# Patient Record
Sex: Male | Born: 1940 | Race: White | Hispanic: No | Marital: Married | Smoking: Former smoker
Health system: Southern US, Community
[De-identification: ages and names within clinical notes are randomized; demographics above are authoritative.]

## PROBLEM LIST (undated history)

## (undated) DIAGNOSIS — E039 Hypothyroidism, unspecified: Secondary | ICD-10-CM

## (undated) DIAGNOSIS — M545 Low back pain, unspecified: Secondary | ICD-10-CM

## (undated) DIAGNOSIS — Z9889 Other specified postprocedural states: Secondary | ICD-10-CM

## (undated) DIAGNOSIS — G4733 Obstructive sleep apnea (adult) (pediatric): Secondary | ICD-10-CM

## (undated) DIAGNOSIS — M1990 Unspecified osteoarthritis, unspecified site: Secondary | ICD-10-CM

## (undated) DIAGNOSIS — M439 Deforming dorsopathy, unspecified: Secondary | ICD-10-CM

## (undated) DIAGNOSIS — G8929 Other chronic pain: Secondary | ICD-10-CM

## (undated) DIAGNOSIS — E7800 Pure hypercholesterolemia, unspecified: Secondary | ICD-10-CM

## (undated) DIAGNOSIS — N281 Cyst of kidney, acquired: Secondary | ICD-10-CM

## (undated) DIAGNOSIS — R112 Nausea with vomiting, unspecified: Secondary | ICD-10-CM

## (undated) DIAGNOSIS — C73 Malignant neoplasm of thyroid gland: Secondary | ICD-10-CM

## (undated) DIAGNOSIS — C61 Malignant neoplasm of prostate: Secondary | ICD-10-CM

## (undated) DIAGNOSIS — Z9989 Dependence on other enabling machines and devices: Secondary | ICD-10-CM

## (undated) DIAGNOSIS — I10 Essential (primary) hypertension: Secondary | ICD-10-CM

## (undated) HISTORY — PX: JOINT REPLACEMENT: SHX530

## (undated) HISTORY — PX: UMBILICAL HERNIA REPAIR: SHX196

## (undated) HISTORY — PX: BACK SURGERY: SHX140

## (undated) HISTORY — PX: TONSILLECTOMY: SUR1361

## (undated) HISTORY — PX: HERNIA REPAIR: SHX51

---

## 1991-08-25 HISTORY — PX: LUMBAR DISC SURGERY: SHX700

## 1999-08-25 DIAGNOSIS — C61 Malignant neoplasm of prostate: Secondary | ICD-10-CM

## 1999-08-25 HISTORY — PX: PROSTATECTOMY: SHX69

## 1999-08-25 HISTORY — DX: Malignant neoplasm of prostate: C61

## 1999-12-30 ENCOUNTER — Encounter: Payer: Self-pay | Admitting: Internal Medicine

## 1999-12-30 ENCOUNTER — Ambulatory Visit (HOSPITAL_COMMUNITY): Admission: RE | Admit: 1999-12-30 | Discharge: 1999-12-30 | Payer: Self-pay | Admitting: Internal Medicine

## 2000-02-26 ENCOUNTER — Other Ambulatory Visit: Admission: RE | Admit: 2000-02-26 | Discharge: 2000-02-26 | Payer: Self-pay | Admitting: Urology

## 2000-02-26 ENCOUNTER — Encounter (INDEPENDENT_AMBULATORY_CARE_PROVIDER_SITE_OTHER): Payer: Self-pay | Admitting: Specialist

## 2000-04-12 ENCOUNTER — Encounter (INDEPENDENT_AMBULATORY_CARE_PROVIDER_SITE_OTHER): Payer: Self-pay | Admitting: Specialist

## 2000-04-12 ENCOUNTER — Inpatient Hospital Stay (HOSPITAL_COMMUNITY): Admission: RE | Admit: 2000-04-12 | Discharge: 2000-04-15 | Payer: Self-pay | Admitting: Urology

## 2000-08-24 HISTORY — PX: CARDIAC CATHETERIZATION: SHX172

## 2001-06-21 ENCOUNTER — Ambulatory Visit (HOSPITAL_COMMUNITY): Admission: RE | Admit: 2001-06-21 | Discharge: 2001-06-21 | Payer: Self-pay | Admitting: *Deleted

## 2001-07-28 ENCOUNTER — Emergency Department (HOSPITAL_COMMUNITY): Admission: EM | Admit: 2001-07-28 | Discharge: 2001-07-28 | Payer: Self-pay

## 2003-03-16 ENCOUNTER — Ambulatory Visit (HOSPITAL_COMMUNITY): Admission: RE | Admit: 2003-03-16 | Discharge: 2003-03-16 | Payer: Self-pay | Admitting: Gastroenterology

## 2004-02-05 ENCOUNTER — Ambulatory Visit (HOSPITAL_COMMUNITY): Admission: RE | Admit: 2004-02-05 | Discharge: 2004-02-05 | Payer: Self-pay | Admitting: Surgery

## 2009-11-14 ENCOUNTER — Encounter: Admission: RE | Admit: 2009-11-14 | Discharge: 2009-11-14 | Payer: Self-pay | Admitting: Internal Medicine

## 2011-01-09 NOTE — Op Note (Signed)
   NAME:  [name], [name]                      ACCOUNT NO.:  [id number]   MEDICAL RECORD NO.:  [id number]                   PATIENT TYPE:  AMB   LOCATION:  ENDO                                 FACILITY:  [facility]   PHYSICIAN:  Danise Edge, M.D.                DATE OF BIRTH:  1941/03/08   DATE OF PROCEDURE:  DATE OF DISCHARGE:                                 OPERATIVE REPORT   PROCEDURE:  Screening colonoscopy.   PROCEDURE INDICATION:  [name] is a 70 year old male born 12/16/1940.  [name] is scheduled to undergo his first screening  colonoscopy with polypectomy to prevent colon cancer.   ENDOSCOPIST:  Danise Edge, M.D.   PREMEDICATIONS:  Versed 7 mg, Demerol 50 mg.   DESCRIPTION OF PROCEDURE:  After obtaining informed consent [name]  was placed in the left lateral decubitus position.  I administered  intravenous Demerol and intravenous Versed to achieve conscious sedation for  the procedure.  The patient's blood pressure, oxygen saturation, and cardiac  rhythm were monitored throughout the procedure and documented in the medical  record.   Anal inspection was normal.  Digital rectal exam was normal.  The prostate  was non-nodular.  The Olympus adult colonoscope was introduced into the  rectum and advanced to the cecum.  A normal appearing ileocecal valve  intubated and the distal ileum inspected.  Colonic preparation for the exam  today was excellent.   RECTUM:  Normal.   SIGMOID COLON AND DESCENDING COLON:  Normal.   SPLENIC FLEXURE:  Normal.   TRANSVERSE COLON:  Normal.   HEPATIC FLEXURE:  Normal.   ASCENDING COLON:  Normal.   CECUM AND ILEOCECAL VALVE:  Normal.   DISTAL ILEUM:  Normal.    ASSESSMENT:  Normal screening proctocolonoscopy to the cecum,  No endoscopic  evidence for the presence of colorectal neoplasia.                                               Danise Edge, M.D.    MJ/MEDQ  D:  03/16/2003  T:   03/17/2003  Job:  [id number]   cc:   Georgann Housekeeper, M.D.  [address]  [place]  [place] [address]  Fax: [phone]

## 2011-01-09 NOTE — H&P (Signed)
[place]  Patient:    [name], [name]                   MRN: [id number] Adm. Date:  [id number] Attending:  Lauree Chandler CC:         Lum Babe, M.D.                         History and Physical  HISTORY OF PRESENT ILLNESS:  This 70 year old white male had a PSA of 5.0 in April 2001 with a free PSA of 6%, which put him at some risk for prostate cancer.  He has a past history of having had an elevated PSA in 1997.  It was felt to be due to prostatitis and, in fact, the biopsy in December 1997 was benign.  PSA has been in the range of 3.6.  I wanted him to see me in mid-1998, but I did not see him until May of this year, when he was referred back by Dr. Demetrius Revel with a PSA of 5.0 as noted above.  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  He and his wife were counseled about surgical and radiation therapy for cure.  He opted for radical retropubic prostatectomy.  PAST MEDICAL HISTORY:  He is in general good health.  He does have hypertension.  MEDICATIONS:  He takes 10 mg a day of Monopril for hypertension.  PAST SURGICAL HISTORY:  He has had back surgery before.  ALLERGIES:  None.  HABITS:  Tobacco:  None.  Alcohol:  None.  FAMILY HISTORY:  Noncontributory.  REVIEW OF SYSTEMS:  As noted on health history form.  PHYSICAL EXAMINATION:  GENERAL:  He is a healthy-appearing white male who looks younger than his stated age.  He is in no acute distress, in no respiratory distress.  HEART:  Heart tones regular.  ABDOMEN:  Soft and nontender.  GENITOURINARY:  External genitalia normal.  Prostate feels benign.  EXTREMITIES:  No edema.  IMPRESSION: 1. Prostatic carcinoma. 2. Hypertension.  PLAN:  Radical retropubic prostatectomy, bilateral pelvic lymph node dissection. DD:   04/12/00 TD:  04/13/00 Job: [id number]

## 2011-01-09 NOTE — Discharge Summary (Signed)
[place]  Patient:    [name]                   MRN: [id number] Adm. Date:  [id number] Disc. Date: [id number] Attending:  Lauree Chandler CC:         Lum Babe, M.D.   Discharge Summary  FINAL DIAGNOSES: 1. Prostatic carcinoma. 2. Hypertension.  PROCEDURES:  Radical retropubic prostatectomy and bilateral pelvic lymph node dissection.  HISTORY OF PRESENT ILLNESS:  This 70 year old white male was found to have a PSA of 5.0 with free PSA percentage of 6%.  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  He was counseled about therapy and admitted for prostatectomy.  HOSPITAL COURSE:  After admission he underwent a radical retropubic prostatectomy.  His postop course was unremarkable with just some mild pulmonary fever that cleared.  His catheter drained well.  He had no significant Blake drainage.  Blake drain was removed on the day of discharge. He was tolerating his diet well, at that time having passed flatus.  He was ambulating well and ready for discharge.  Final pathology is pending.  DISCHARGE MEDICATIONS: 1. He is to go home on 10 mg of Monopril a day for his hypertension. 2. Tylox for pain.  ACTIVITY:  He will be on light activity for six weeks.  FOLLOWUP:  He will return to the office next week for follow-up.  INSTRUCTIONS:  He will go home with a Foley catheter attached to leg bag.  CONDITION ON DISCHARGE:  He was sent home in good condition. DD:  04/15/00 TD:  04/16/00 Job: [id number]

## 2011-01-09 NOTE — Cardiovascular Report (Signed)
[place]  Patient:    [name], [name] Visit Number: [id number] MRN: [id number]          Service Type: Attending:  Lemont Fillers. Fraser Din, M.D. Dictated by:   Lemont Fillers Fraser Din, M.D. Proc. Date: 06/21/01                          Cardiac Catheterization  PROCEDURE PERFORMED: Left heart catheterization, coronary angiography, single plane ventriculogram.  INDICATIONS FOR PROCEDURE: Decreased ejection fraction of 45% and redistribution in the anterior apex.  REFERRING PHYSICIAN: Tyson Dense, M.D.  DESCRIPTION OF PROCEDURE: After obtaining written informed consent, the patient was brought to the cardiac catheterization lab in the postabsorptive state.  Preoperative sedation was achieved using IV Versed.  The right groin was prepped and draped in the usual sterile fashion.  Local anesthesia was achieved using 1% Xylocaine.  A 6 French hemostasis sheath was placed into the right femoral artery using a modified Seldinger technique.  Selective coronary angiography was performed using a JL4, JR4 Judkins catheter.  Single plane ventriculogram was performed in the RAO position using a 6 French pigtail curved catheter. All catheter exchange were made over a guide wire.  The hemostasis sheath was flushed following each engagement.  FINDINGS: The aortic pressure is 122/64, LV pressure is 122/10. Single plane ventriculogram revealed normal wall motion. There was no mitral regurgitation noted. Ejection fraction of approximately 65%.  CORONARY ANGIOGRAPHY: Left main coronary artery: The left main coronary bifurcates into the left anterior descending and circumflex vessel. No significant disease in the left main coronary artery.  Left anterior descending: The left anterior descending gave rise to a moderate diagonal #1, small diagonal #2, small diagonal #3 and goes on to end as an apical recurrent branch.  There was luminal irregularities in the  left anterior descending of up to 30%.  Circumflex vessel: The circumflex vessel is a moderate sized vessel giving rise to a small OM-1, moderate OM-2, large OM-3, large OM-4 and small OM-5 and ends as an AV groove vessel. There is a 50% lesion noted in the second obtuse marginal.  This is followed followed by a tandem lesion in the circumflex following the OM-3 of up to 60%.  Right coronary artery: The right coronary artery is a large artery and is dominant for the posterior circulation, gives rise to a moderate RV marginal #1, RV marginal #2 and a large RV marginal #3. The right coronary artery then gives rise to a small PDA and a moderate sized PL branch. There is no significant disease in the right coronary artery or its branches.  FINAL IMPRESSION: 1. 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 There    is no evidence of redistribution on the Cardiolite in this region. 2. Normal ventriculogram.  RECOMMENDATIONS: The films will be reviewed with Dr. Katrinka Blazing. Medical management will be the first consideration. Dictated by:   Lemont Fillers Fraser Din, M.D. Attending:  Lemont Fillers Fraser Din, M.D. DD:  06/21/01 TD:  06/21/01 Job: [id number]

## 2011-01-09 NOTE — Op Note (Signed)
[place]  Patient:    [name], [name]                   MRN: [id number] Proc. Date: 04/12/00 Adm. Date:  [id number] Attending:  Lauree Chandler CC:         Lum Babe, M.D.   Operative Report  PREOPERATIVE DIAGNOSIS:  Carcinoma of the prostate.  POSTOPERATIVE DIAGNOSIS:  Carcinoma of the prostate.  OPERATION PERFORMED:  Radical retropubic prostatectomy and bilateral pelvic lymph node dissection.  SURGEON:  Maretta Bees. Vonita Moss, M.D.  ASSISTANT:  Veverly Fells. Vernie Ammons, M.D.  ANESTHESIA:  General.  INDICATIONS FOR PROCEDURE:  This gentleman is 70 years old and was found to have prostate carcinoma on needle biopsy in June when his PSA rose to 5 with a free PSA percentage of only 6%.  DESCRIPTION OF PROCEDURE:  The patient was brought to the operating room and placed in supine position.  The lower abdomen and external genitalia were prepped and draped in the usual fashion.  A #24 French 30 cc Foley catheter was inserted in the bladder.  A vertical midline superpubic incision was made. The rectus fascia divided in the midline.  Both pelvic retroperitoneal spaces were dissected out.  His main iliac artery and vein ran fairly vertical on each side and the obturator nerves were deep in the pelvis bilaterally but the obturator lymph node packets on each side were removed without injury to those major structures.  He had a lot of superficial vasculature over the dorsum of the prostate.  In fact he had a lot of venous complex below the endopelvic fascia which was opened on each side.  The prostate was well under the symphysis pubis.  After taking down the endopelvic fascia, the puboprostatic ligaments were taken down and Vicryl ties were placed around the dorsal venous complex.  The dorsal vein complex was divided just beyond the apex of the prostate which was brought up off the rectum without difficulty.  He had a lot of prostate pedicle  tissue, which was divided and ligated with a series of metal hemoclips.  The posterior aspect of the seminal vessicles was dissected out and they really had a dense posterior fascial covering.  The bladder neck was then opened anteriorly and the ureteral orifices were well away from the bladder neck extruding indigo carmine stained urine.  The posterior bladder neck was divided and a plane was easily found between the posterior bladder and the seminal vesicles.  The ampulla of each vas was divided and clipped with metal hemoclips and the tips of each seminal vesicle were divided and clipped.  The specimen was removed intact.  The bladder mucosa was reapproximated to the edge of the bladder neck musculature with interrupted 4-0 chromic catgut.  The posterior bladder neck was closed in tennis racket fashion with 2-0 chromic catgut.  A 22 French 5 cc Foley catheter was then inserted into the urethra.  Later a heavy prolene was brought through the tip and brought out through the anterior wall of the bladder and subsequently through the abdominal wall and sewn to skin surface over a button.  The bladderr neck and urethral anastomosis was completed with a series of 2-0 Vicryl placed at 2, 5, 7, 10 and 12 oclock at the bladder neck.  The bladder was irrigated with triple antibiotic solution and irrigation was clear.  The stab wound through the left of her incision was utilized to place a large Blake drain  to drain the prevesical space.  The drain was sewn in place with a black silk and connected to bulb suction.  His Foley catheter had 15 cc and the balloon was placed on traction and connected to closed drainage.  The rectus fascia was closed with running #1 PDS.  The subcutaneous was irrigated and closed and skin closed with skin staples.  The wound was cleaned and dressed with dry sterile gauze dressings.  He was taken to the recovery room in good condition.  Sponge, needle and instrument counts  were correct. Estimated blood loss was 800 to 1000 cc. DD:  04/12/00 TD:  04/12/00 Job: [id number]

## 2011-01-09 NOTE — Op Note (Signed)
NAME:  [name], [name]                      ACCOUNT NO.:  [id number]   MEDICAL RECORD NO.:  [id number]                   PATIENT TYPE:  OIB   LOCATION:  NA                                   FACILITY:  [facility]   PHYSICIAN:  Abigail Miyamoto, M.D.              DATE OF BIRTH:  30-Dec-1940   DATE OF PROCEDURE:  02/05/2004  DATE OF DISCHARGE:                                 OPERATIVE REPORT   PREOPERATIVE DIAGNOSIS:  Umbilical hernia.   POSTOPERATIVE DIAGNOSIS:  Umbilical hernia.   PROCEDURE:  Umbilical hernia.   SURGEON:  Douglas A. Magnus Ivan, M.D.   ANESTHESIA:  General endotracheal anesthesia, 0.25% Marcaine with  epinephrine.   ESTIMATED BLOOD LOSS:  Minimal.   PROCEDURE IN DETAIL:  The patient was brought to the operating room and  identified as [name].  He was placed supine on the operating  table, general anesthesia was induced.  His abdomen was then prepped and  draped in the usual sterile fashion.  Using a 15 blade, a small transverse  incision was made below the umbilicus.  The incision was carried down to the  hernia sac which was identified and circumferentially excised with  electrocautery.  It was only found to contain preperitoneal fat and the rest  of the omentum was pushed into the abdominal cavity.  Next, three #1 Novafil  pop-off sutures were used to close the small fascial defect in a figure-of-  eight fashion.  Excellent closure of the fascia appeared to be achieved.  The wound was then anesthetized with 0.25% Marcaine.  The subcutaneous layer  was closed with interrupted 3-0 Vicryl sutures.  The skin was closed with  running 4-0 Vicryl suture.  Steri-Strips and staples were applied.  The  patient tolerated the procedure well.  All sponge, needle and instrument  counts were correct at the end of the procedure.  The patient was then  extubated in the operating room and taken in stable condition to the  recovery room.                 Abigail Miyamoto, M.D.    DB/MEDQ  D:  02/05/2004  T:  02/05/2004  Job:  [id number]

## 2016-01-08 ENCOUNTER — Other Ambulatory Visit: Payer: Self-pay | Admitting: Urology

## 2016-01-08 DIAGNOSIS — N281 Cyst of kidney, acquired: Secondary | ICD-10-CM

## 2016-01-13 ENCOUNTER — Ambulatory Visit (HOSPITAL_COMMUNITY)
Admission: RE | Admit: 2016-01-13 | Discharge: 2016-01-13 | Disposition: A | Discharge status: Home / Self Care | Payer: Medicare Other | Source: Ambulatory Visit | Attending: Urology | Admitting: Urology

## 2016-01-13 DIAGNOSIS — N281 Cyst of kidney, acquired: Secondary | ICD-10-CM | POA: Diagnosis present

## 2016-01-13 DIAGNOSIS — N289 Disorder of kidney and ureter, unspecified: Secondary | ICD-10-CM | POA: Insufficient documentation

## 2016-01-13 MED ORDER — GADOBENATE DIMEGLUMINE 529 MG/ML IV SOLN
20.0000 mL | Freq: Once | INTRAVENOUS | Status: AC | PRN
  Administered 2016-01-13: 20 mL via INTRAVENOUS

## 2016-03-02 ENCOUNTER — Other Ambulatory Visit: Payer: Self-pay | Admitting: Urology

## 2016-03-02 DIAGNOSIS — N289 Disorder of kidney and ureter, unspecified: Secondary | ICD-10-CM

## 2016-04-23 ENCOUNTER — Ambulatory Visit (HOSPITAL_COMMUNITY)
Admission: RE | Admit: 2016-04-23 | Discharge: 2016-04-23 | Disposition: A | Discharge status: Home / Self Care | Payer: Medicare Other | Source: Ambulatory Visit | Attending: Urology | Admitting: Urology

## 2016-04-23 DIAGNOSIS — N289 Disorder of kidney and ureter, unspecified: Secondary | ICD-10-CM | POA: Insufficient documentation

## 2016-04-23 LAB — POCT I-STAT CREATININE: Creatinine, Ser: 1 mg/dL (ref 0.61–1.24)

## 2016-04-23 MED ORDER — GADOBENATE DIMEGLUMINE 529 MG/ML IV SOLN
20.0000 mL | Freq: Once | INTRAVENOUS | Status: AC | PRN
  Administered 2016-04-23: 20 mL via INTRAVENOUS

## 2016-05-11 ENCOUNTER — Other Ambulatory Visit: Payer: Self-pay | Admitting: Urology

## 2016-05-11 DIAGNOSIS — D4102 Neoplasm of uncertain behavior of left kidney: Secondary | ICD-10-CM

## 2016-05-13 ENCOUNTER — Ambulatory Visit (HOSPITAL_COMMUNITY)
Admission: RE | Admit: 2016-05-13 | Discharge: 2016-05-13 | Disposition: A | Discharge status: Home / Self Care | Payer: Medicare Other | Source: Ambulatory Visit | Attending: Urology | Admitting: Urology

## 2016-05-13 DIAGNOSIS — D4102 Neoplasm of uncertain behavior of left kidney: Secondary | ICD-10-CM | POA: Insufficient documentation

## 2016-05-19 ENCOUNTER — Other Ambulatory Visit: Payer: Self-pay | Admitting: Urology

## 2016-05-19 DIAGNOSIS — N2889 Other specified disorders of kidney and ureter: Secondary | ICD-10-CM

## 2016-05-27 ENCOUNTER — Other Ambulatory Visit: Payer: Self-pay | Admitting: Radiology

## 2016-05-28 ENCOUNTER — Other Ambulatory Visit: Payer: Self-pay | Admitting: General Surgery

## 2016-05-29 ENCOUNTER — Ambulatory Visit (HOSPITAL_COMMUNITY)
Admission: RE | Admit: 2016-05-29 | Discharge: 2016-05-29 | Disposition: A | Discharge status: Home / Self Care | Payer: Medicare Other | Source: Ambulatory Visit | Attending: Urology | Admitting: Urology

## 2016-05-29 ENCOUNTER — Encounter (HOSPITAL_COMMUNITY): Payer: Self-pay

## 2016-05-29 DIAGNOSIS — D3002 Benign neoplasm of left kidney: Secondary | ICD-10-CM | POA: Insufficient documentation

## 2016-05-29 DIAGNOSIS — E7800 Pure hypercholesterolemia, unspecified: Secondary | ICD-10-CM | POA: Insufficient documentation

## 2016-05-29 DIAGNOSIS — I10 Essential (primary) hypertension: Secondary | ICD-10-CM | POA: Diagnosis not present

## 2016-05-29 DIAGNOSIS — Z87891 Personal history of nicotine dependence: Secondary | ICD-10-CM | POA: Insufficient documentation

## 2016-05-29 DIAGNOSIS — N2889 Other specified disorders of kidney and ureter: Secondary | ICD-10-CM | POA: Diagnosis present

## 2016-05-29 DIAGNOSIS — Z8546 Personal history of malignant neoplasm of prostate: Secondary | ICD-10-CM | POA: Diagnosis not present

## 2016-05-29 HISTORY — DX: Malignant neoplasm of prostate: C61

## 2016-05-29 HISTORY — DX: Pure hypercholesterolemia, unspecified: E78.00

## 2016-05-29 HISTORY — DX: Essential (primary) hypertension: I10

## 2016-05-29 LAB — CBC
HCT: 44.6 % (ref 39.0–52.0)
HEMOGLOBIN: 14.7 g/dL (ref 13.0–17.0)
MCH: 29.8 pg (ref 26.0–34.0)
MCHC: 33 g/dL (ref 30.0–36.0)
MCV: 90.5 fL (ref 78.0–100.0)
Platelets: 213 10*3/uL (ref 150–400)
RBC: 4.93 MIL/uL (ref 4.22–5.81)
RDW: 14.4 % (ref 11.5–15.5)
WBC: 8.7 10*3/uL (ref 4.0–10.5)

## 2016-05-29 LAB — PROTIME-INR
INR: 1.11
PROTHROMBIN TIME: 14.3 s (ref 11.4–15.2)

## 2016-05-29 LAB — APTT: APTT: 32 s (ref 24–36)

## 2016-05-29 MED ORDER — FENTANYL CITRATE (PF) 100 MCG/2ML IJ SOLN
INTRAMUSCULAR | Status: AC
  Filled 2016-05-29: qty 2

## 2016-05-29 MED ORDER — GELATIN ABSORBABLE 12-7 MM EX MISC
CUTANEOUS | Status: AC
  Filled 2016-05-29: qty 1

## 2016-05-29 MED ORDER — SODIUM CHLORIDE 0.9 % IV SOLN: INTRAVENOUS | Status: DC

## 2016-05-29 MED ORDER — LIDOCAINE HCL (PF) 1 % IJ SOLN
INTRAMUSCULAR | Status: AC
  Filled 2016-05-29: qty 10

## 2016-05-29 MED ORDER — MIDAZOLAM HCL 2 MG/2ML IJ SOLN
INTRAMUSCULAR | Status: AC | PRN
  Administered 2016-05-29: 1 mg via INTRAVENOUS

## 2016-05-29 MED ORDER — FENTANYL CITRATE (PF) 100 MCG/2ML IJ SOLN
INTRAMUSCULAR | Status: AC | PRN
  Administered 2016-05-29: 25 ug via INTRAVENOUS

## 2016-05-29 MED ORDER — MIDAZOLAM HCL 2 MG/2ML IJ SOLN
INTRAMUSCULAR | Status: AC
  Filled 2016-05-29: qty 2

## 2016-05-29 MED ORDER — HYDROCODONE-ACETAMINOPHEN 5-325 MG PO TABS: 1.0000 | ORAL_TABLET | ORAL | Status: DC | PRN

## 2016-05-29 NOTE — H&P (Signed)
Chief Complaint: Patient was seen in consultation today for lefttrenal mass biopsy at the request of Eskridge,Matthew  Referring Physician(s): Eskridge,Matthew  Supervising Physician: Sandi Mariscal  Patient Status: Outpatient  History of Present Illness: [name] is a 75 y.o. male   Remote Hx prostate Ca---surgical removal Known left renal mass since 11/2015 Discovered upon work up for hematuria MRI 12/2015 and 04/2016 IMPRESSION: 1. Poorly defined focus of homogeneous hyper enhancement in the interpolar left kidney is stable in the interval. While this interval stability is reassuring, continued follow-up is recommended as renal cell carcinoma not excluded. Given stability over 3 months, follow-up in 6-12 months could be considered. 2. The posterior lesion seen previously upper pole right kidney measures stable on the current study (15 mm today compared to 13 mm previous MRI and 16 mm on previous CT of 01/01/2016). 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 Given follow-up will be required for 1 above, continued attention to this lesion on follow-up is suggested. 3. The tiny hyper enhancing focus in the subcapsular lateral segment left liver is almost an apparent on today's study. This is probably a tiny transient hepatic attenuation difference, potentially related to vascular malformation. This could also be further assessed at the time of followup imaging.  [place] 05/13/16: IMPRESSION: Solid mass in the left kidney may be slightly larger compared to recent MRI. Given apparent slight increase in this mass, follow-up with abdominal MRI pre and post-contrast at this time may well be warranted. Renal cell carcinoma is not excluded in this circumstance. Elsewhere there are small renal cysts bilaterally. No obstructing focus in either kidney.  Now scheduled for biopsy of left renal mass per Dr Junious Silk  request  Past Medical History:  Diagnosis Date  . High cholesterol   . Hypertension   . Prostate CA [facility])     Past Surgical History:  Procedure Laterality Date  . BACK SURGERY    . HERNIA REPAIR    . PROSTATE SURGERY  2001  . TONSILLECTOMY      Allergies: Other  Medications: Prior to Admission medications   Medication Sig Start Date End Date Taking? Authorizing Provider  lisinopril (PRINIVIL,ZESTRIL) 20 MG tablet Take 20 mg by mouth daily.   Yes Historical Provider, MD  simvastatin (ZOCOR) 40 MG tablet Take 40 mg by mouth daily at 6 PM.   Yes Historical Provider, MD  timolol (BETIMOL) 0.5 % ophthalmic solution Apply 1 drop to eye 2 (two) times daily.   Yes Historical Provider, MD     Family History  Problem Relation Age of Onset  . Lymphoma Mother   . Pancreatic cancer Father     Social History   Social History  . Marital status: Married    Spouse name: N/A  . Number of children: N/A  . Years of education: N/A   Social History Main Topics  . Smoking status: Former Smoker    Types: Cigarettes    Quit date: 1973  . Smokeless tobacco: Never Used  . Alcohol use Yes     Comment: once in a while  . Drug use: No  . Sexual activity: Not Asked   Other Topics Concern  . None   Social History Narrative  . None     Review of Systems: A 12 point ROS discussed and pertinent positives are indicated in the HPI above.  All other systems are negative.  Review of Systems  Constitutional: Negative for  activity change, appetite change, fatigue, fever and unexpected weight change.  Respiratory: Negative for shortness of breath.   Gastrointestinal: Negative for abdominal pain and nausea.  Genitourinary: Negative for decreased urine volume, difficulty urinating, dysuria, flank pain and hematuria.  Musculoskeletal: Negative for back pain.  Neurological: Negative for weakness.  Psychiatric/Behavioral: Negative for behavioral problems and confusion.    Vital Signs: BP  (!) 146/83   Pulse (!) 55   Temp 98.2 F (36.8 C) (Oral)   Resp 18   Ht 6' (1.829 m)   Wt 240 lb (108.9 kg)   SpO2 98%   BMI 32.55 kg/m   Physical Exam  Constitutional: He is oriented to person, place, and time. He appears well-nourished.  Cardiovascular: Normal rate, regular rhythm and normal heart sounds.   Pulmonary/Chest: Effort normal and breath sounds normal.  Abdominal: Soft. Bowel sounds are normal.  Musculoskeletal: Normal range of motion.  Neurological: He is alert and oriented to person, place, and time.  Skin: Skin is warm and dry.  Psychiatric: He has a normal mood and affect. His behavior is normal. Judgment and thought content normal.  Nursing note and vitals reviewed.   Mallampati Score:  MD Evaluation Airway: WNL Heart: WNL Abdomen: WNL Chest/ Lungs: WNL ASA  Classification: 2 Mallampati/Airway Score: One  Imaging: US Renal  Result Date: 05/13/2016 CLINICAL DATA:  Noncystic left renal mass EXAM: RENAL / URINARY TRACT ULTRASOUND COMPLETE COMPARISON:  Abdominal MRI April 23, 2016 and abdominal MRI Jan 13, 2016 FINDINGS: Right Kidney: Length: 11.9 cm. Echogenicity and renal cortical thickness are within normal limits. No perinephric fluid or hydronephrosis visualized. There is a cyst arising from the upper pole the right kidney measuring 2.0 x 1.8 x 1.6 cm. A cyst in the mid right kidney measures 1.7 x 1.5 x 1.2 cm. No noncystic renal masses are identified on the right. No sonographically demonstrable calculus or ureterectasis appreciable. Left Kidney: Length: 12.7 cm. Echogenicity and renal cortical thickness are within normal limits. No perinephric fluid or hydronephrosis visualized. The previously noted mass in the mid left kidney is slightly hyperechoic and measures 2.0 x 1.4 x 1.4 cm. A cyst in the upper pole left kidney measures 0.7 x 0.8 x 0.7 cm. No new mass evident. No sonographically demonstrable calculus or ureterectasis. Bladder: Appears normal for  degree of bladder distention. IMPRESSION: Solid mass in the left kidney may be slightly larger compared to recent MRI. Given apparent slight increase in this mass, follow-up with abdominal MRI pre and post-contrast at this time may well be warranted. Renal cell carcinoma is not excluded in this circumstance. Elsewhere there are small renal cysts bilaterally. No obstructing focus in either kidney. These results will be called to the ordering clinician or representative by the Radiologist Assistant, and communication documented in the PACS or zVision Dashboard. Electronically Signed   By: Lowella Grip III M.D.   On: 05/13/2016 08:20    Labs:  CBC:  Recent Labs  05/29/16 0606  WBC 8.7  HGB 14.7  HCT 44.6  PLT 213    COAGS: No results for input(s): INR, APTT in the last 8760 hours.  BMP:  Recent Labs  04/23/16 1740  CREATININE 1.00    LIVER FUNCTION TESTS: No results for input(s): BILITOT, AST, ALT, ALKPHOS, PROT, ALBUMIN in the last 8760 hours.  TUMOR MARKERS: No results for input(s): AFPTM, CEA, CA199, CHROMGRNA in the last 8760 hours.  Assessment and Plan:  Hx Prostate Ca Left renal mass--followed now 6 months Scheduled  for biopsy today Risks and Benefits discussed with the patient including, but not limited to bleeding, infection, damage to adjacent structures or low yield requiring additional tests. All of the patient's questions were answered, patient is agreeable to proceed. Consent signed and in chart.  Thank you for this interesting consult.  I greatly enjoyed meeting Consuello Bossier and look forward to participating in their care.  A copy of this report was sent to the requesting provider on this date.  Electronically Signed: Monia Sabal A 05/29/2016, 7:11 AM   I spent a total of  30 Minutes   in face to face in clinical consultation, greater than 50% of which was counseling/coordinating care for left renal mass bx

## 2016-05-29 NOTE — Procedures (Signed)
Interventional Radiology Procedure Note  Procedure:  US guided left renal mass biopsy  Complications: None  Estimated Blood Loss: 10 mL  2 cm hyperechoic interpolar mass of left kidney sampled x 2 with 18 G core device.  Gelfoam slurry injected through outer 17 G needle on completion.  Venetia Night. Kathlene Cote, M.D Pager:  [phone]

## 2016-05-29 NOTE — Sedation Documentation (Signed)
Biopsy taken.

## 2016-05-29 NOTE — Sedation Documentation (Signed)
Patient is resting comfortably. 

## 2016-05-29 NOTE — Sedation Documentation (Signed)
Patient is resting comfortably. Vitals stable. 

## 2016-05-29 NOTE — Discharge Instructions (Signed)
Kidney Biopsy, Care After °Refer to this sheet in the next few weeks. These instructions provide you with information on caring for yourself after your procedure. Your health care provider may also give you more specific instructions. Your treatment has been planned according to current medical practices, but problems sometimes occur. Call your health care provider if you have any problems or questions after your procedure.  °WHAT TO EXPECT AFTER THE PROCEDURE  °· You may notice blood in the urine for the first 24 hours after the biopsy. °· You may feel some pain at the biopsy site for 1-2 weeks after the biopsy. °HOME CARE INSTRUCTIONS °· Do not lift anything heavier than 10 lb (4.5 kg) for 2 weeks. °· Do not take any non-steroidal anti-inflammatory drugs (NSAIDs) or any blood thinners for a week after the biopsy unless instructed to do so by your health care provider. °· Only take medicines for pain, fever, or discomfort as directed by your health care provider. °SEEK MEDICAL CARE IF: °· You have bloody urine more than 24 hours after the biopsy.   °· You develop a fever.   °· You cannot urinate.   °· You have increasing pain at the biopsy site.   °SEEK IMMEDIATE MEDICAL CARE IF: °You feel faint or dizzy.  °  °This information is not intended to replace advice given to you by your health care provider. Make sure you discuss any questions you have with your health care provider. °  °Document Released: 04/12/2013 Document Reviewed: 04/12/2013 °Elsevier Interactive Patient Education ©2016 Elsevier Inc. ° °

## 2016-05-29 NOTE — Sedation Documentation (Signed)
Vital signs stable. 

## 2016-11-09 ENCOUNTER — Other Ambulatory Visit: Payer: Self-pay | Admitting: Urology

## 2016-11-09 DIAGNOSIS — D3002 Benign neoplasm of left kidney: Secondary | ICD-10-CM

## 2016-12-15 ENCOUNTER — Ambulatory Visit (HOSPITAL_COMMUNITY)
Admission: RE | Admit: 2016-12-15 | Discharge: 2016-12-15 | Disposition: A | Discharge status: Home / Self Care | Payer: Medicare Other | Source: Ambulatory Visit | Attending: Urology | Admitting: Urology

## 2016-12-15 DIAGNOSIS — D3002 Benign neoplasm of left kidney: Secondary | ICD-10-CM | POA: Insufficient documentation

## 2016-12-15 DIAGNOSIS — N281 Cyst of kidney, acquired: Secondary | ICD-10-CM | POA: Diagnosis not present

## 2016-12-21 ENCOUNTER — Other Ambulatory Visit: Payer: Self-pay | Admitting: Internal Medicine

## 2016-12-21 ENCOUNTER — Ambulatory Visit
Admission: RE | Admit: 2016-12-21 | Discharge: 2016-12-21 | Disposition: A | Discharge status: Home / Self Care | Payer: Medicare Other | Source: Ambulatory Visit | Attending: Internal Medicine | Admitting: Internal Medicine

## 2016-12-21 DIAGNOSIS — R221 Localized swelling, mass and lump, neck: Secondary | ICD-10-CM

## 2016-12-21 MED ORDER — IOPAMIDOL (ISOVUE-300) INJECTION 61%
75.0000 mL | Freq: Once | INTRAVENOUS | Status: AC | PRN
  Administered 2016-12-21: 75 mL via INTRAVENOUS

## 2016-12-22 ENCOUNTER — Other Ambulatory Visit (HOSPITAL_COMMUNITY): Payer: Self-pay | Admitting: Internal Medicine

## 2016-12-22 DIAGNOSIS — E041 Nontoxic single thyroid nodule: Secondary | ICD-10-CM

## 2016-12-28 ENCOUNTER — Other Ambulatory Visit: Payer: Self-pay | Admitting: Radiology

## 2016-12-29 ENCOUNTER — Encounter (HOSPITAL_COMMUNITY): Payer: Self-pay

## 2016-12-29 ENCOUNTER — Ambulatory Visit (HOSPITAL_COMMUNITY)
Admission: RE | Admit: 2016-12-29 | Discharge: 2016-12-29 | Disposition: A | Discharge status: Home / Self Care | Payer: Medicare Other | Source: Ambulatory Visit | Attending: Internal Medicine | Admitting: Internal Medicine

## 2016-12-29 ENCOUNTER — Other Ambulatory Visit (HOSPITAL_COMMUNITY): Payer: Self-pay | Admitting: Internal Medicine

## 2016-12-29 DIAGNOSIS — R590 Localized enlarged lymph nodes: Secondary | ICD-10-CM | POA: Insufficient documentation

## 2016-12-29 DIAGNOSIS — E042 Nontoxic multinodular goiter: Secondary | ICD-10-CM | POA: Insufficient documentation

## 2016-12-29 DIAGNOSIS — Z8546 Personal history of malignant neoplasm of prostate: Secondary | ICD-10-CM | POA: Insufficient documentation

## 2016-12-29 DIAGNOSIS — G4730 Sleep apnea, unspecified: Secondary | ICD-10-CM | POA: Insufficient documentation

## 2016-12-29 DIAGNOSIS — E041 Nontoxic single thyroid nodule: Secondary | ICD-10-CM

## 2016-12-29 DIAGNOSIS — E7800 Pure hypercholesterolemia, unspecified: Secondary | ICD-10-CM | POA: Insufficient documentation

## 2016-12-29 DIAGNOSIS — I10 Essential (primary) hypertension: Secondary | ICD-10-CM | POA: Diagnosis not present

## 2016-12-29 LAB — CBC WITH DIFFERENTIAL/PLATELET
BASOS ABS: 0 10*3/uL (ref 0.0–0.1)
Basophils Relative: 0 %
EOS PCT: 2 %
Eosinophils Absolute: 0.2 10*3/uL (ref 0.0–0.7)
HEMATOCRIT: 43.9 % (ref 39.0–52.0)
Hemoglobin: 14.9 g/dL (ref 13.0–17.0)
LYMPHS ABS: 2.2 10*3/uL (ref 0.7–4.0)
LYMPHS PCT: 28 %
MCH: 30.1 pg (ref 26.0–34.0)
MCHC: 33.9 g/dL (ref 30.0–36.0)
MCV: 88.7 fL (ref 78.0–100.0)
Monocytes Absolute: 0.6 10*3/uL (ref 0.1–1.0)
Monocytes Relative: 8 %
Neutro Abs: 4.8 10*3/uL (ref 1.7–7.7)
Neutrophils Relative %: 62 %
PLATELETS: 236 10*3/uL (ref 150–400)
RBC: 4.95 MIL/uL (ref 4.22–5.81)
RDW: 14.4 % (ref 11.5–15.5)
WBC: 7.7 10*3/uL (ref 4.0–10.5)

## 2016-12-29 LAB — PROTIME-INR
INR: 1.16
Prothrombin Time: 14.9 seconds (ref 11.4–15.2)

## 2016-12-29 LAB — BASIC METABOLIC PANEL
ANION GAP: 7 (ref 5–15)
BUN: 14 mg/dL (ref 6–20)
CHLORIDE: 105 mmol/L (ref 101–111)
CO2: 26 mmol/L (ref 22–32)
Calcium: 9.7 mg/dL (ref 8.9–10.3)
Creatinine, Ser: 0.96 mg/dL (ref 0.61–1.24)
GFR calc Af Amer: >60 mL/min (ref >60)
GFR calc non Af Amer: >60 mL/min (ref >60)
GLUCOSE: 98 mg/dL (ref 65–99)
POTASSIUM: 4.1 mmol/L (ref 3.5–5.1)
Sodium: 138 mmol/L (ref 135–145)

## 2016-12-29 MED ORDER — FENTANYL CITRATE (PF) 100 MCG/2ML IJ SOLN
INTRAMUSCULAR | Status: AC
  Filled 2016-12-29: qty 2

## 2016-12-29 MED ORDER — SODIUM CHLORIDE 0.9 % IV SOLN
INTRAVENOUS | Status: DC
  Administered 2016-12-29: 11:00:00 via INTRAVENOUS

## 2016-12-29 MED ORDER — MIDAZOLAM HCL 2 MG/2ML IJ SOLN
INTRAMUSCULAR | Status: AC | PRN
  Administered 2016-12-29 (×3): 1 mg via INTRAVENOUS

## 2016-12-29 MED ORDER — MIDAZOLAM HCL 2 MG/2ML IJ SOLN
INTRAMUSCULAR | Status: AC
  Filled 2016-12-29: qty 6

## 2016-12-29 MED ORDER — FENTANYL CITRATE (PF) 100 MCG/2ML IJ SOLN
INTRAMUSCULAR | Status: AC | PRN
  Administered 2016-12-29: 50 ug via INTRAVENOUS
  Administered 2016-12-29: 25 ug via INTRAVENOUS

## 2016-12-29 NOTE — H&P (Signed)
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 Patient also with remote history of prostate cancer. He now presents with history of left neck mass and imaging on 12/21/16 which revealed malignant adenopathy in the left neck in addition to a left thyroid lobe nodule. He is scheduled today for image guided left cervical lymph node and possible left thyroid lobe nodule biopsies. He currently denies fever, chest pain, dyspnea, cough, abdominal/back pain, nausea, vomiting or abnormal bleeding. He does have occasional headaches.   Past Medical History:  Diagnosis Date  . High cholesterol   . Hypertension   . Prostate CA ([place])   . Sleep apnea    uses CPAP   Past Surgical History:  Procedure Laterality Date  . BACK SURGERY    . HERNIA REPAIR    . PROSTATE SURGERY  2001  . TONSILLECTOMY      Allergies: Codeine  Medications: Prior to Admission medications   Medication Sig Start Date End Date Taking? Authorizing Provider  lisinopril (PRINIVIL,ZESTRIL) 20 MG tablet Take 20 mg by mouth daily.   Yes [provider]  simvastatin (ZOCOR) 40 MG tablet Take 40 mg by mouth daily at 6 PM.   Yes [provider]  timolol (BETIMOL) 0.5 % ophthalmic solution Apply 1 drop to eye 2 (two) times daily.   Yes [provider]     Vital Signs: BP (!) 167/99   Pulse (!) 55   Temp 98.1 F (36.7 C) (Oral)   Resp 16   Ht 6' (1.829 m)   Wt 240 lb 4 oz (109 kg)   SpO2 98%   BMI 32.58 kg/m   Physical Exam patient awake, alert. Palpable left neck adenopathy , NT; Heart with slightly bradycardic but regular rhythm. Abdomen soft, positive bowel sounds, nontender. Lower extremities with 1+ edema bilaterally.  Imaging: No results  found.  Labs:  CBC:  Recent Labs  05/29/16 0606 12/29/16 1117  WBC 8.7 7.7  HGB 14.7 14.9  HCT 44.6 43.9  PLT 213 236    COAGS:  Recent Labs  05/29/16 0606 12/29/16 1117  INR 1.11 1.16  APTT 32  --     BMP:  Recent Labs  04/23/16 1740 12/29/16 1117  NA  --  138  K  --  4.1  CL  --  105  CO2  --  26  GLUCOSE  --  98  BUN  --  14  CALCIUM  --  9.7  CREATININE 1.00 0.96  GFRNONAA  --  >60  GFRAA  --  >60    LIVER FUNCTION TESTS: No results for input(s): BILITOT, AST, ALT, ALKPHOS, PROT, ALBUMIN in the last 8760 hours.  Assessment and Plan: Pt with remote history of prostate cancer as well as left renal oncocytoma ; now with left neck mass and imaging on 12/21/16 which revealed malignant adenopathy in the left neck in addition to a left thyroid lobe nodule. He is scheduled today for image guided left cervical lymph node and possible left thyroid lobe nodule biopsies.Risks and benefits discussed with the patient /spouse including, but not limited to bleeding, infection, damage to adjacent structures or low yield requiring additional tests. All of the patient's questions were answered, patient is agreeable to proceed. Consent signed and in chart.  Electronically Signed: D. Nash Mantis 12/29/2016, 11:59 AM   I spent a total of 20 minutes at the the patient's bedside AND on the patient's hospital floor or unit, greater than 50% of which was counseling/coordinating care for Image guided left cervical lymph node and possible left thyroid lobe nodule biopsies

## 2016-12-29 NOTE — Discharge Instructions (Addendum)
Moderate Conscious Sedation, Adult, Care After °These instructions provide you with information about caring for yourself after your procedure. Your health care provider may also give you more specific instructions. Your treatment has been planned according to current medical practices, but problems sometimes occur. Call your health care provider if you have any problems or questions after your procedure. °What can I expect after the procedure? °After your procedure, it is common: °· To feel sleepy for several hours. °· To feel clumsy and have poor balance for several hours. °· To have poor judgment for several hours. °· To vomit if you eat too soon. °Follow these instructions at home: °For at least 24 hours after the procedure:  ° °· Do not: °¨ Participate in activities where you could fall or become injured. °¨ Drive. °¨ Use heavy machinery. °¨ Drink alcohol. °¨ Take sleeping pills or medicines that cause drowsiness. °¨ Make important decisions or sign legal documents. °¨ Take care of children on your own. °· Rest. °Eating and drinking  °· Follow the diet recommended by your health care provider. °· If you vomit: °¨ Drink water, juice, or soup when you can drink without vomiting. °¨ Make sure you have little or no nausea before eating solid foods. °General instructions  °· Have a responsible adult stay with you until you are awake and alert. °· Take over-the-counter and prescription medicines only as told by your health care provider. °· If you smoke, do not smoke without supervision. °· Keep all follow-up visits as told by your health care provider. This is important. °Contact a health care provider if: °· You keep feeling nauseous or you keep vomiting. °· You feel light-headed. °· You develop a rash. °· You have a fever. °Get help right away if: °· You have trouble breathing. °This information is not intended to replace advice given to you by your health care provider. Make sure you discuss any questions you  have with your health care provider. °Document Released: 05/31/2013 Document Revised: 01/13/2016 Document Reviewed: 11/30/2015 °Elsevier Interactive Patient Education © 2017 Elsevier Inc[employer] °Needle Biopsy of the Lung, Care After °This sheet gives you information about how to care for yourself after your procedure. Your health care provider may also give you more specific instructions. If you have problems or questions, contact your health care provider. °What can I expect after the procedure? °After the procedure, it is common to have: °· Soreness, pain, and tenderness where a tissue sample was taken (biopsy site). °· A cough. °· A sore throat. °Follow these instructions at home: °Biopsy site care  °· Follow instructions from your health care provider about when to remove the bandage that was placed on the biopsy site. °· Keep the bandage dry until it has been removed. °· Check your biopsy site every day for signs of infection. Check for: °¨ More redness, swelling, or pain. °¨ More fluid or blood. °¨ Warmth to the touch. °¨ Pus or a bad smell. °General instructions  °· Rest as directed by your health care provider. Ask your health care provider what activities are safe for you. °· Do not take baths, swim, or use a hot tub until your health care provider approves. °· Take over-the-counter and prescription medicines only as told by your health care provider. °· If you have airplane travel scheduled, talk with your health care provider about when it is safe for you to travel by airplane. °· It is up to you to get the results of your procedure. Ask   your health care provider, or the department that is doing the procedure, when your results will be ready. °· Keep all follow-up visits as told by your health care provider. This is important. °Contact a health care provider if: °· You have more redness, swelling, or pain around your biopsy site. °· You have more fluid or blood coming from your biopsy site. °· Your biopsy site  feels warm to the touch. °· You have pus or a bad smell coming from your biopsy site. °· You have a fever. °· You have pain that does not get better with medicine. °Get help right away if: °· You have problems breathing. °· You have chest pain. °· You cough up blood. °· You faint. °· You have a fast heart rate. °Summary °· After a needle biopsy of the lung, it is common to have a cough, a sore throat, or soreness, pain, and tenderness where a tissue sample was taken (biopsy site). °· You should check your biopsy area every day for signs of infection, including pus or a bad smell, warmth, more fluid or blood, or more redness, swelling, or pain. °· You should not take baths, swim, or use a hot tub until your health care provider approves. °· It is up to you to get the results of your procedure. Ask your health care provider, or the department that is doing the procedure, when your results will be ready. °This information is not intended to replace advice given to you by your health care provider. Make sure you discuss any questions you have with your health care provider. °Document Released: 06/07/2007 Document Revised: 07/01/2016 Document Reviewed: 07/01/2016 °Elsevier Interactive Patient Education © 2017 Elsevier Inc. ° °

## 2016-12-29 NOTE — Sedation Documentation (Signed)
Patient is resting comfortably. 

## 2016-12-29 NOTE — Procedures (Signed)
1. FNA bx L cervical LAN 2. FNA bx L thyroid nodule No complication No blood loss. See complete dictation in [facility].

## 2017-02-03 NOTE — H&P (Signed)
HPI:   [name] is a 76 y.o. male who presents as a consult Patient.   Referring Provider: Wenda Low, MD  Chief complaint: Thyroid cancer.  HPI: He felt a lump in his neck on the left side a few months ago. It did not go away. He had a CT scan which revealed a metastatic appearing lymph node, a couple of other associated nodes on the left side and a left thyroid mass. Ultrasound biopsy of the thyroid and the dominant left cervical lymph node were consistent with thyroid carcinoma. Otherwise in very good health.  PMH/Meds/All/SocHx/FamHx/ROS:   Past Medical History:  Diagnosis Date  . Arthritis  . Glaucoma  . Hearing loss  . Hypertension   Past Surgical History:  Procedure Laterality Date  . back surgery  . HERNIA REPAIR  . oral surg  . TONSILLECTOMY   No family history of bleeding disorders, wound healing problems or difficulty with anesthesia.   Social History   Social History  . Marital status: Married  Spouse name: N/A  . Number of children: N/A  . Years of education: N/A   Occupational History  . Not on file.   Social History Main Topics  . Smoking status: Never Smoker  . Smokeless tobacco: Never Used  . Alcohol use Not on file  . Drug use: Unknown  . Sexual activity: Not on file   Other Topics Concern  . Not on file   Social History Narrative  . No narrative on file   Current Outpatient Prescriptions:  . aspirin 81 MG EC tablet *ANTIPLATELET*, Take by mouth daily., Disp: , Rfl:  . LISINOPRIL ORAL, Take by mouth., Disp: , Rfl:  . SIMVASTATIN ORAL, Take by mouth., Disp: , Rfl:  . tramadol HCl (TRAMADOL ORAL), Take by mouth., Disp: , Rfl:   A complete ROS was performed with pertinent positives/negatives noted in the HPI. The remainder of the ROS are negative.   Physical Exam:   Ht 1.803 m (5\' 11" )  Wt 112.9 kg (249 lb)  BMI 34.73 kg/m   General: Healthy and alert, in no distress, breathing easily. Normal affect. In a pleasant  mood. Head: Normocephalic, atraumatic. No masses, or scars. Eyes: Pupils are equal, and reactive to light. Vision is grossly intact. No spontaneous or gaze nystagmus. Ears: Ear canals are clear. Tympanic membranes are intact, with normal landmarks and the middle ears are clear and healthy. Hearing: Grossly normal. Nose: Nasal cavities are clear with healthy mucosa, no polyps or exudate.Airways are patent. Face: No masses or scars, facial nerve function is symmetric. Oral Cavity: No mucosal abnormalities are noted. Tongue with normal mobility. Dentition appears healthy. Oropharynx: Tonsils are symmetric. There are no mucosal masses identified. Tongue base appears normal and healthy. Larynx/Hypopharynx: indirect exam reveals healthy, mobile vocal cords, without mucosal lesions in the hypopharynx or larynx. Chest: Deferred Neck: There is a 2 cm node palpable just inferior to the left submandibular gland. There is a smaller nodule palpable in the left inferior thyroid. Neuro: Cranial nerves II-XII will normal function. Balance: Normal gate. Other findings: none.  Independent Review of Additional Tests or Records:  none  Procedures:  none  Impression & Plans:  Papillary thyroid carcinoma, with metastatic lymph nodes. Recommend total thyroidectomy with left modified neck dissection including levels 1 through 4. Risks and benefits were discussed including infection, anesthesia complications, bleeding, recurrent nerve and parathyroid gland injury. All questions were answered. We will schedule at his convenience.

## 2017-02-09 NOTE — Pre-Procedure Instructions (Signed)
[place]  02/09/2017      [facility] - [place] - [address] [place] [address] Phone: [phone] Fax: [phone]    Your procedure is scheduled on June 22  Report to [place] at [phone] A.M.  Call this number if you have problems the morning of surgery:  [phone]   Remember:  Do not eat food or drink liquids after midnight.   Take these medicines the morning of surgery: eye drops   7 days prior to surgery STOP taking any Aspirin, Aleve, Naproxen, Ibuprofen, Motrin, Advil, Goody's, BC's, all herbal medications, fish oil, and all vitamins    Do not wear jewelry  Do not wear lotions, powders, or cologne, or deoderant.  Men may shave face and neck.  Do not bring valuables to the hospital.  [facility] is not responsible for any belongings or valuables.  Contacts, dentures or bridgework may not be worn into surgery.  Leave your suitcase in the car.  After surgery it may be brought to your room.  For patients admitted to the hospital, discharge time will be determined by your treatment team.  Patients discharged the day of surgery will not be allowed to drive home.    Special instructions:   [place]- Preparing For Surgery  Before surgery, you can play an important role. Because skin is not sterile, your skin needs to be as free of germs as possible. You can reduce the number of germs on your skin by washing with CHG (chlorahexidine gluconate) Soap before surgery.  CHG is an antiseptic cleaner which kills germs and bonds with the skin to continue killing germs even after washing.  Please do not use if you have an allergy to CHG or antibacterial soaps. If your skin becomes reddened/irritated stop using the CHG.  Do not shave (including legs and underarms) for at least 48 hours prior to first CHG shower. It is OK to shave your face.  Please follow these  instructions carefully.   1. Shower the NIGHT BEFORE SURGERY and the MORNING OF SURGERY with CHG.   2. If you chose to wash your hair, wash your hair first as usual with your normal shampoo.  3. After you shampoo, rinse your hair and body thoroughly to remove the shampoo.  4. Use CHG as you would any other liquid soap. You can apply CHG directly to the skin and wash gently with a scrungie or a clean washcloth.   5. Apply the CHG Soap to your body ONLY FROM THE NECK DOWN.  Do not use on open wounds or open sores. Avoid contact with your eyes, ears, mouth and genitals (private parts). Wash genitals (private parts) with your normal soap.  6. Wash thoroughly, paying special attention to the area where your surgery will be performed.  7. Thoroughly rinse your body with warm water from the neck down.  8. DO NOT shower/wash with your normal soap after using and rinsing off the CHG Soap.  9. Pat yourself dry with a CLEAN TOWEL.   10. Wear CLEAN PAJAMAS   11. Place CLEAN SHEETS on your bed the night of your first shower and DO NOT SLEEP WITH PETS.    Day of Surgery: Do not apply any deodorants/lotions. Please wear clean clothes to the hospital/surgery center.      Please read over the following fact sheets that you were given.

## 2017-02-10 ENCOUNTER — Encounter (HOSPITAL_COMMUNITY): Payer: Self-pay

## 2017-02-10 ENCOUNTER — Encounter (HOSPITAL_COMMUNITY)
Admission: RE | Admit: 2017-02-10 | Discharge: 2017-02-10 | Disposition: A | Discharge status: Home / Self Care | Payer: Medicare Other | Source: Ambulatory Visit | Attending: Otolaryngology | Admitting: Otolaryngology

## 2017-02-10 ENCOUNTER — Ambulatory Visit (HOSPITAL_COMMUNITY)
Admission: RE | Admit: 2017-02-10 | Discharge: 2017-02-10 | Disposition: A | Discharge status: Home / Self Care | Payer: Medicare Other | Source: Ambulatory Visit | Attending: Otolaryngology | Admitting: Otolaryngology

## 2017-02-10 DIAGNOSIS — Z01812 Encounter for preprocedural laboratory examination: Secondary | ICD-10-CM

## 2017-02-10 DIAGNOSIS — C73 Malignant neoplasm of thyroid gland: Secondary | ICD-10-CM

## 2017-02-10 DIAGNOSIS — R001 Bradycardia, unspecified: Secondary | ICD-10-CM | POA: Insufficient documentation

## 2017-02-10 DIAGNOSIS — I10 Essential (primary) hypertension: Secondary | ICD-10-CM

## 2017-02-10 DIAGNOSIS — Z01818 Encounter for other preprocedural examination: Secondary | ICD-10-CM

## 2017-02-10 DIAGNOSIS — I4510 Unspecified right bundle-branch block: Secondary | ICD-10-CM

## 2017-02-10 DIAGNOSIS — N281 Cyst of kidney, acquired: Secondary | ICD-10-CM

## 2017-02-10 DIAGNOSIS — Z01811 Encounter for preprocedural respiratory examination: Secondary | ICD-10-CM

## 2017-02-10 HISTORY — DX: Malignant neoplasm of thyroid gland: C73

## 2017-02-10 HISTORY — DX: Cyst of kidney, acquired: N28.1

## 2017-02-10 HISTORY — DX: Unspecified osteoarthritis, unspecified site: M19.90

## 2017-02-10 LAB — BASIC METABOLIC PANEL
Anion gap: 8 (ref 5–15)
BUN: 12 mg/dL (ref 6–20)
CALCIUM: 9.4 mg/dL (ref 8.9–10.3)
CHLORIDE: 105 mmol/L (ref 101–111)
CO2: 24 mmol/L (ref 22–32)
CREATININE: 1 mg/dL (ref 0.61–1.24)
GFR calc non Af Amer: >60 mL/min (ref >60)
Glucose, Bld: 97 mg/dL (ref 65–99)
Potassium: 5.2 mmol/L — ABNORMAL HIGH (ref 3.5–5.1)
SODIUM: 137 mmol/L (ref 135–145)

## 2017-02-10 LAB — CBC
HCT: 45.3 % (ref 39.0–52.0)
HEMOGLOBIN: 15.2 g/dL (ref 13.0–17.0)
MCH: 29.9 pg (ref 26.0–34.0)
MCHC: 33.6 g/dL (ref 30.0–36.0)
MCV: 89.2 fL (ref 78.0–100.0)
Platelets: 190 10*3/uL (ref 150–400)
RBC: 5.08 MIL/uL (ref 4.22–5.81)
RDW: 14.2 % (ref 11.5–15.5)
WBC: 9.9 10*3/uL (ref 4.0–10.5)

## 2017-02-10 NOTE — Progress Notes (Signed)
PCP - Dr. Deforest Hoyles [phone] Cardiologist - n/a  Chest x-ray - 02/10/2017 EKG - 02/10/2017 Stress Test - >10+ years ago  ECHO - n/a Cardiac Cath - 2002  Sleep Study - [place]; "about 8 years ago" CPAP - yes; monitored by [place]   Patient denies shortness of breath, fever, cough and chest pain at PAT appointment   Patient verbalized understanding of instructions that were given to them at the PAT appointment. Patient was also instructed that they will need to review over the PAT instructions again at home before surgery.   Anesthesia to review chart for EKG

## 2017-02-11 MED ORDER — CEFAZOLIN SODIUM-DEXTROSE 2-4 GM/100ML-% IV SOLN
2.0000 g | INTRAVENOUS | Status: AC
  Administered 2017-02-12: 2 g via INTRAVENOUS

## 2017-02-12 ENCOUNTER — Encounter (HOSPITAL_COMMUNITY): Payer: Self-pay | Admitting: Surgery

## 2017-02-12 ENCOUNTER — Inpatient Hospital Stay (HOSPITAL_COMMUNITY)
Admission: RE | Admit: 2017-02-12 | Discharge: 2017-02-13 | DRG: 626 | Disposition: A | Discharge status: Home / Self Care | Payer: Medicare Other | Source: Ambulatory Visit | Attending: Otolaryngology | Admitting: Otolaryngology

## 2017-02-12 ENCOUNTER — Inpatient Hospital Stay (HOSPITAL_COMMUNITY): Payer: Medicare Other | Admitting: Certified Registered"

## 2017-02-12 ENCOUNTER — Encounter (HOSPITAL_COMMUNITY)
Admission: RE | Disposition: A | Discharge status: Home / Self Care | Payer: Self-pay | Source: Ambulatory Visit | Attending: Otolaryngology

## 2017-02-12 DIAGNOSIS — G4730 Sleep apnea, unspecified: Secondary | ICD-10-CM | POA: Diagnosis present

## 2017-02-12 DIAGNOSIS — C770 Secondary and unspecified malignant neoplasm of lymph nodes of head, face and neck: Secondary | ICD-10-CM | POA: Diagnosis present

## 2017-02-12 DIAGNOSIS — R221 Localized swelling, mass and lump, neck: Secondary | ICD-10-CM | POA: Diagnosis not present

## 2017-02-12 DIAGNOSIS — I10 Essential (primary) hypertension: Secondary | ICD-10-CM | POA: Diagnosis present

## 2017-02-12 DIAGNOSIS — L7632 Postprocedural hematoma of skin and subcutaneous tissue following other procedure: Secondary | ICD-10-CM | POA: Diagnosis not present

## 2017-02-12 DIAGNOSIS — H409 Unspecified glaucoma: Secondary | ICD-10-CM | POA: Diagnosis present

## 2017-02-12 DIAGNOSIS — Z87891 Personal history of nicotine dependence: Secondary | ICD-10-CM | POA: Diagnosis not present

## 2017-02-12 DIAGNOSIS — M1990 Unspecified osteoarthritis, unspecified site: Secondary | ICD-10-CM | POA: Diagnosis not present

## 2017-02-12 DIAGNOSIS — C73 Malignant neoplasm of thyroid gland: Principal | ICD-10-CM | POA: Diagnosis present

## 2017-02-12 HISTORY — PX: THYROIDECTOMY: SHX17

## 2017-02-12 HISTORY — PX: RADICAL NECK DISSECTION: SHX2284

## 2017-02-12 LAB — CALCIUM
CALCIUM: 8.5 mg/dL — AB (ref 8.9–10.3)
Calcium: 8.6 mg/dL — ABNORMAL LOW (ref 8.9–10.3)

## 2017-02-12 SURGERY — THYROIDECTOMY
Anesthesia: General

## 2017-02-12 MED ORDER — ONDANSETRON HCL 4 MG/2ML IJ SOLN
INTRAMUSCULAR | Status: DC | PRN
  Administered 2017-02-12: 4 mg via INTRAVENOUS

## 2017-02-12 MED ORDER — EPHEDRINE SULFATE-NACL 50-0.9 MG/10ML-% IV SOSY
PREFILLED_SYRINGE | INTRAVENOUS | Status: DC | PRN
  Administered 2017-02-12: 5 mg via INTRAVENOUS

## 2017-02-12 MED ORDER — SUCCINYLCHOLINE CHLORIDE 200 MG/10ML IV SOSY
PREFILLED_SYRINGE | INTRAVENOUS | Status: DC | PRN
  Administered 2017-02-12: 80 mg via INTRAVENOUS

## 2017-02-12 MED ORDER — ROCURONIUM BROMIDE 10 MG/ML (PF) SYRINGE
PREFILLED_SYRINGE | INTRAVENOUS | Status: DC | PRN
  Administered 2017-02-12: 60 mg via INTRAVENOUS

## 2017-02-12 MED ORDER — FENTANYL CITRATE (PF) 100 MCG/2ML IJ SOLN: 25.0000 ug | INTRAMUSCULAR | Status: DC | PRN

## 2017-02-12 MED ORDER — SUGAMMADEX SODIUM 200 MG/2ML IV SOLN
INTRAVENOUS | Status: DC | PRN
Start: 2017-02-12 — End: 2017-02-12
  Administered 2017-02-12: 200 mg via INTRAVENOUS

## 2017-02-12 MED ORDER — DEXTROSE-NACL 5-0.9 % IV SOLN
INTRAVENOUS | Status: DC
  Administered 2017-02-12 – 2017-02-13 (×2): via INTRAVENOUS

## 2017-02-12 MED ORDER — 0.9 % SODIUM CHLORIDE (POUR BTL) OPTIME
TOPICAL | Status: DC | PRN
  Administered 2017-02-12: 1000 mL

## 2017-02-12 MED ORDER — LABETALOL HCL 5 MG/ML IV SOLN
INTRAVENOUS | Status: DC | PRN
  Administered 2017-02-12: 5 mg via INTRAVENOUS

## 2017-02-12 MED ORDER — LEVOTHYROXINE SODIUM 100 MCG PO TABS: 100.0000 ug | ORAL_TABLET | Freq: Every day | ORAL | 6 refills | Status: DC

## 2017-02-12 MED ORDER — PHENYLEPHRINE HCL 10 MG/ML IJ SOLN
INTRAVENOUS | Status: DC | PRN
  Administered 2017-02-12: 35 ug/min via INTRAVENOUS

## 2017-02-12 MED ORDER — PROMETHAZINE HCL 25 MG RE SUPP: 25.0000 mg | Freq: Four times a day (QID) | RECTAL | Status: DC | PRN

## 2017-02-12 MED ORDER — LABETALOL HCL 5 MG/ML IV SOLN
INTRAVENOUS | Status: AC
  Filled 2017-02-12: qty 4

## 2017-02-12 MED ORDER — LIDOCAINE-EPINEPHRINE 1 %-1:100000 IJ SOLN
INTRAMUSCULAR | Status: AC
  Filled 2017-02-12: qty 1

## 2017-02-12 MED ORDER — IBUPROFEN 100 MG/5ML PO SUSP
400.0000 mg | Freq: Four times a day (QID) | ORAL | Status: DC | PRN
  Administered 2017-02-12: 400 mg via ORAL
  Filled 2017-02-12 (×3): qty 20

## 2017-02-12 MED ORDER — SIMVASTATIN 40 MG PO TABS
40.0000 mg | ORAL_TABLET | Freq: Every day | ORAL | Status: DC
  Administered 2017-02-12: 40 mg via ORAL
  Filled 2017-02-12: qty 1

## 2017-02-12 MED ORDER — LEVOTHYROXINE SODIUM 100 MCG PO TABS
100.0000 ug | ORAL_TABLET | Freq: Every day | ORAL | Status: DC
  Administered 2017-02-13: 100 ug via ORAL
  Filled 2017-02-12: qty 1

## 2017-02-12 MED ORDER — PHENYLEPHRINE 40 MCG/ML (10ML) SYRINGE FOR IV PUSH (FOR BLOOD PRESSURE SUPPORT)
PREFILLED_SYRINGE | INTRAVENOUS | Status: DC | PRN
  Administered 2017-02-12: 80 ug via INTRAVENOUS
  Administered 2017-02-12: 40 ug via INTRAVENOUS

## 2017-02-12 MED ORDER — ARTIFICIAL TEARS OPHTHALMIC OINT
TOPICAL_OINTMENT | OPHTHALMIC | Status: AC
  Filled 2017-02-12: qty 3.5

## 2017-02-12 MED ORDER — ONDANSETRON HCL 4 MG/2ML IJ SOLN
INTRAMUSCULAR | Status: AC
  Filled 2017-02-12: qty 2

## 2017-02-12 MED ORDER — OXYCODONE HCL 5 MG/5ML PO SOLN: 5.0000 mg | Freq: Once | ORAL | Status: DC | PRN

## 2017-02-12 MED ORDER — ACETAMINOPHEN 500 MG PO TABS
1000.0000 mg | ORAL_TABLET | Freq: Two times a day (BID) | ORAL | Status: DC
  Administered 2017-02-12 – 2017-02-13 (×2): 1000 mg via ORAL
  Filled 2017-02-12 (×2): qty 2

## 2017-02-12 MED ORDER — CALCIUM CARBONATE ANTACID 500 MG PO CHEW
2.0000 | CHEWABLE_TABLET | Freq: Three times a day (TID) | ORAL | Status: DC
  Administered 2017-02-12 – 2017-02-13 (×2): 400 mg via ORAL
  Filled 2017-02-12 (×2): qty 2

## 2017-02-12 MED ORDER — PROMETHAZINE HCL 25 MG PO TABS: 25.0000 mg | ORAL_TABLET | Freq: Four times a day (QID) | ORAL | Status: DC | PRN

## 2017-02-12 MED ORDER — PROPOFOL 10 MG/ML IV BOLUS
INTRAVENOUS | Status: DC | PRN
  Administered 2017-02-12: 160 mg via INTRAVENOUS
  Administered 2017-02-12: 40 mg via INTRAVENOUS

## 2017-02-12 MED ORDER — DEXAMETHASONE SODIUM PHOSPHATE 10 MG/ML IJ SOLN
INTRAMUSCULAR | Status: DC | PRN
  Administered 2017-02-12: 10 mg via INTRAVENOUS

## 2017-02-12 MED ORDER — BACITRACIN ZINC 500 UNIT/GM EX OINT
TOPICAL_OINTMENT | CUTANEOUS | Status: AC
  Filled 2017-02-12: qty 28.35

## 2017-02-12 MED ORDER — LIDOCAINE 2% (20 MG/ML) 5 ML SYRINGE
INTRAMUSCULAR | Status: AC
  Filled 2017-02-12: qty 5

## 2017-02-12 MED ORDER — SUGAMMADEX SODIUM 200 MG/2ML IV SOLN
INTRAVENOUS | Status: AC
  Filled 2017-02-12: qty 2

## 2017-02-12 MED ORDER — ASPIRIN EC 81 MG PO TBEC
81.0000 mg | DELAYED_RELEASE_TABLET | Freq: Every day | ORAL | Status: DC
  Administered 2017-02-12 – 2017-02-13 (×2): 81 mg via ORAL
  Filled 2017-02-12 (×2): qty 1

## 2017-02-12 MED ORDER — FENTANYL CITRATE (PF) 250 MCG/5ML IJ SOLN
INTRAMUSCULAR | Status: AC
  Filled 2017-02-12: qty 5

## 2017-02-12 MED ORDER — BACITRACIN ZINC 500 UNIT/GM EX OINT
1.0000 "application " | TOPICAL_OINTMENT | Freq: Three times a day (TID) | CUTANEOUS | Status: DC
  Administered 2017-02-12 – 2017-02-13 (×3): 1 via TOPICAL
  Filled 2017-02-12: qty 28.35

## 2017-02-12 MED ORDER — LIDOCAINE-EPINEPHRINE 1 %-1:100000 IJ SOLN
INTRAMUSCULAR | Status: DC | PRN
  Administered 2017-02-12: 10 mL

## 2017-02-12 MED ORDER — LACTATED RINGERS IV SOLN
INTRAVENOUS | Status: DC | PRN
  Administered 2017-02-12: 11:00:00 via INTRAVENOUS

## 2017-02-12 MED ORDER — DEXAMETHASONE SODIUM PHOSPHATE 10 MG/ML IJ SOLN
INTRAMUSCULAR | Status: AC
  Filled 2017-02-12: qty 1

## 2017-02-12 MED ORDER — TIMOLOL MALEATE 0.5 % OP SOLN
1.0000 [drp] | Freq: Every day | OPHTHALMIC | Status: DC
  Administered 2017-02-13: 1 [drp] via OPHTHALMIC
  Filled 2017-02-12: qty 5

## 2017-02-12 MED ORDER — LACTATED RINGERS IV SOLN
INTRAVENOUS | Status: DC
  Administered 2017-02-12: 08:00:00 via INTRAVENOUS

## 2017-02-12 MED ORDER — PROPOFOL 10 MG/ML IV BOLUS
INTRAVENOUS | Status: AC
  Filled 2017-02-12: qty 20

## 2017-02-12 MED ORDER — FENTANYL CITRATE (PF) 100 MCG/2ML IJ SOLN
INTRAMUSCULAR | Status: DC | PRN
  Administered 2017-02-12: 100 ug via INTRAVENOUS
  Administered 2017-02-12 (×2): 50 ug via INTRAVENOUS
  Administered 2017-02-12: 150 ug via INTRAVENOUS

## 2017-02-12 MED ORDER — LISINOPRIL 20 MG PO TABS
20.0000 mg | ORAL_TABLET | Freq: Every day | ORAL | Status: DC
Start: 2017-02-12 — End: 2017-02-13
  Administered 2017-02-12 – 2017-02-13 (×2): 20 mg via ORAL
  Filled 2017-02-12 (×2): qty 1

## 2017-02-12 MED ORDER — OXYCODONE HCL 5 MG PO TABS: 5.0000 mg | ORAL_TABLET | Freq: Once | ORAL | Status: DC | PRN

## 2017-02-12 SURGICAL SUPPLY — 65 items
ADH SKN CLS APL DERMABOND .7 (GAUZE/BANDAGES/DRESSINGS) ×2
APPLIER CLIP 9.375 SM OPEN (CLIP)
APR CLP SM 9.3 20 MLT OPN (CLIP)
BLADE SURG 15 STRL LF DISP TIS (BLADE) IMPLANT
BLADE SURG 15 STRL SS (BLADE)
CANISTER SUCT 3000ML PPV (MISCELLANEOUS) ×4 IMPLANT
CLEANER TIP ELECTROSURG 2X2 (MISCELLANEOUS) ×4 IMPLANT
CLIP APPLIE 9.375 SM OPEN (CLIP) IMPLANT
CONT SPEC 4OZ CLIKSEAL STRL BL (MISCELLANEOUS) ×4 IMPLANT
CORDS BIPOLAR (ELECTRODE) ×4 IMPLANT
COVER SURGICAL LIGHT HANDLE (MISCELLANEOUS) ×4 IMPLANT
DERMABOND ADVANCED (GAUZE/BANDAGES/DRESSINGS) ×2
DERMABOND ADVANCED .7 DNX12 (GAUZE/BANDAGES/DRESSINGS) ×2 IMPLANT
DRAIN CHANNEL 15F RND FF W/TCR (WOUND CARE) IMPLANT
DRAIN HEMOVAC 7FR (DRAIN) IMPLANT
DRAIN SNY 10 ROU (WOUND CARE) IMPLANT
DRAIN WOUND SNY 15 RND (WOUND CARE) ×2 IMPLANT
DRAPE HALF SHEET 40X57 (DRAPES) IMPLANT
DRAPE INCISE 23X17 IOBAN STRL (DRAPES)
DRAPE INCISE 23X17 STRL (DRAPES) IMPLANT
DRAPE INCISE IOBAN 23X17 STRL (DRAPES) IMPLANT
ELECT COATED BLADE 2.86 ST (ELECTRODE) ×6 IMPLANT
ELECT REM PT RETURN 9FT ADLT (ELECTROSURGICAL) ×4
ELECTRODE REM PT RTRN 9FT ADLT (ELECTROSURGICAL) ×2 IMPLANT
EVACUATOR SILICONE 100CC (DRAIN) ×4 IMPLANT
FORCEPS BIPOLAR SPETZLER 8 1.0 (NEUROSURGERY SUPPLIES) ×4 IMPLANT
GAUZE SPONGE 4X4 16PLY XRAY LF (GAUZE/BANDAGES/DRESSINGS) ×6 IMPLANT
GLOVE BIO SURGEON STRL SZ 6.5 (GLOVE) ×3 IMPLANT
GLOVE BIO SURGEONS STRL SZ 6.5 (GLOVE) ×1
GLOVE BIOGEL PI IND STRL 7.0 (GLOVE) IMPLANT
GLOVE BIOGEL PI INDICATOR 7.0 (GLOVE) ×6
GLOVE ECLIPSE 7.5 STRL STRAW (GLOVE) ×8 IMPLANT
GLOVE INDICATOR 7.0 STRL GRN (GLOVE) ×2 IMPLANT
GLOVE INDICATOR 7.5 STRL GRN (GLOVE) ×2 IMPLANT
GLOVE SURG SS PI 6.5 STRL IVOR (GLOVE) ×6 IMPLANT
GOWN STRL REUS W/ TWL LRG LVL3 (GOWN DISPOSABLE) ×4 IMPLANT
GOWN STRL REUS W/TWL LRG LVL3 (GOWN DISPOSABLE) ×16
KIT BASIN OR (CUSTOM PROCEDURE TRAY) ×4 IMPLANT
KIT ROOM TURNOVER OR (KITS) ×4 IMPLANT
LOCATOR NERVE 3 VOLT (DISPOSABLE) IMPLANT
NDL PRECISIONGLIDE 27X1.5 (NEEDLE) ×2 IMPLANT
NEEDLE PRECISIONGLIDE 27X1.5 (NEEDLE) ×4 IMPLANT
NS IRRIG 1000ML POUR BTL (IV SOLUTION) ×4 IMPLANT
PAD ARMBOARD 7.5X6 YLW CONV (MISCELLANEOUS) ×8 IMPLANT
PENCIL FOOT CONTROL (ELECTRODE) ×4 IMPLANT
SHEARS HARMONIC 9CM CVD (BLADE) ×4 IMPLANT
SPECIMEN JAR MEDIUM (MISCELLANEOUS) IMPLANT
SPONGE INTESTINAL PEANUT (DISPOSABLE) ×2 IMPLANT
SPONGE LAP 18X18 X RAY DECT (DISPOSABLE) IMPLANT
STAPLER VISISTAT 35W (STAPLE) ×6 IMPLANT
SUT CHROMIC 3 0 PS 2 (SUTURE) ×4 IMPLANT
SUT CHROMIC 3 0 SH 27 (SUTURE) ×4 IMPLANT
SUT CHROMIC 4 0 PS 2 18 (SUTURE) IMPLANT
SUT CHROMIC 5 0 P 3 (SUTURE) IMPLANT
SUT ETHILON 3 0 PS 1 (SUTURE) ×4 IMPLANT
SUT ETHILON 5 0 PS 2 18 (SUTURE) IMPLANT
SUT SILK 2 0 REEL (SUTURE) IMPLANT
SUT SILK 3 0 REEL (SUTURE) IMPLANT
SUT SILK 3 0 SH CR/8 (SUTURE) ×4 IMPLANT
SUT SILK 4 0 REEL (SUTURE) ×4 IMPLANT
SUT VIC AB 3-0 SH 18 (SUTURE) IMPLANT
TOWEL OR 17X24 6PK STRL BLUE (TOWEL DISPOSABLE) ×4 IMPLANT
TRAY ENT MC OR (CUSTOM PROCEDURE TRAY) ×4 IMPLANT
TRAY FOLEY CATH 16FRSI W/METER (SET/KITS/TRAYS/PACK) ×2 IMPLANT
TUBE FEEDING 10FR FLEXIFLO (MISCELLANEOUS) IMPLANT

## 2017-02-12 NOTE — Interval H&P Note (Signed)
History and Physical Interval Note:  02/12/2017 9:03 AM  [name]  has presented today for surgery, with the diagnosis of Thyroid Cancer and Malignant Neoplasm of Thyroid Metastatatic to Lymphnode or Neck  The various methods of treatment have been discussed with the patient and family. After consideration of risks, benefits and other options for treatment, the patient has consented to  Procedure(s) with comments: THYROIDECTOMY (N/A) - total RADICAL NECK DISSECTION (Left) - Left modified neck dissection levels 1 through 4 as a surgical intervention .  The patient's history has been reviewed, patient examined, no change in status, stable for surgery.  I have reviewed the patient's chart and labs.  Questions were answered to the patient's satisfaction.     Philopateer Strine

## 2017-02-12 NOTE — Discharge Instructions (Signed)
Keep the incision clean and dry. Apply ointment twice daily.

## 2017-02-12 NOTE — Anesthesia Procedure Notes (Addendum)
Arterial Line Insertion Start/End6/22/2018 10:33 AM, 02/12/2017 10:37 AM Performed by: Ivar Drape M  Patient location: OR. Preanesthetic checklist: patient identified, IV checked, site marked, risks and benefits discussed, surgical consent, monitors and equipment checked, pre-op evaluation, timeout performed and anesthesia consent Lidocaine 1% used for infiltration Left, radial was placed Catheter size: 20 Fr Hand hygiene performed  and maximum sterile barriers used   Attempts: 1 Procedure performed without using ultrasound guided technique. Following insertion, dressing applied and Biopatch. Post procedure assessment: normal and unchanged  Patient tolerated the procedure well with no immediate complications.

## 2017-02-12 NOTE — Anesthesia Procedure Notes (Signed)
Procedure Name: Intubation Date/Time: 02/12/2017 10:26 AM Performed by: Willeen Cass P Pre-anesthesia Checklist: Patient identified, Emergency Drugs available, Suction available, Patient being monitored and Timeout performed Patient Re-evaluated:Patient Re-evaluated prior to inductionOxygen Delivery Method: Circle System Utilized Preoxygenation: Pre-oxygenation with 100% oxygen Intubation Type: IV induction Ventilation: Mask ventilation without difficulty Laryngoscope Size: Mac and 4 Grade View: Grade I Tube type: Oral Tube size: 7.0 mm Number of attempts: 1 Airway Equipment and Method: Stylet and Oral airway Placement Confirmation: ETT inserted through vocal cords under direct vision,  positive ETCO2 and breath sounds checked- equal and bilateral Secured at: 22 cm Tube secured with: Tape Dental Injury: Teeth and Oropharynx as per pre-operative assessment

## 2017-02-12 NOTE — Op Note (Signed)
OPERATIVE REPORT  DATE OF SURGERY: 02/12/2017  PATIENT:  [name],  76 y.o. 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 A shoulder roll was placed beneath the shoulder blades and the neck was extended. The neck was prepped and draped in a standard fashion. A low collar transverse incision was outlined with a marking pen, extended over on the left side of the neck, and was incised with electrocautery . Dissection was continued down through the platysma layer. Subplatysmal flaps were developed superiorly and inferiorly providing adequate exposure. Self-retaining thyroid retractor was used throughout the case.  The midline fascia was divided. The strap muscle was reflected off of the right lobe of the gland. The right lobe was soft and of normal size without any palpable masses. The superior pole was reflected inferiorly and the superior vasculature was separately ligated between clamps and divided. 4-0 silk ties were used. The middle thyroid vein was ligated as well. The gland was brought forward exposing the inferior vessels which were also ligated. Harmonic scalpel was used for smaller vessels. A suspected parathyroid gland was identified and preserved with its blood supply. Berry ligament was dissected using cautery. The recurrent nerve was identified  and preserved. The gland was brought off the trachea.  The left lobe was dissected in a similar fashion although there were some differences due to the disease process. The gland was larger with a large very hard mass and was adherent to the trachea in a couple of places. 2 dissected clean from the trachea. The nerve was also identified and preserved. A parathyroid was identified and preserved with its blood supply. The gland was adhered to the overlying strap muscles so a cuff of sternohyoid muscle was taken with the specimen. The left lobe was marked with a suture and the specimen was delivered. Palpation of the anterior compartment revealed no other palpable adenopathy.  Selective left modified neck dissection levels 2, 3 and 4. The fascia overlying the lateral aspect of the sternocleidomastoid muscle was dissected forward. The muscle was reflected posteriorly. Deep fascia was brought forward exposing the cutaneous branches of the cervical plexus and the spinal accessory nerve which were all preserved. There are multiple enlarged and very dark lymph nodes identified in levels 2, 3 and 4. The thoracic Duct was identified and preserved. Dissection continued forward to the internal jugular vein. The vein was dissected free of surrounding fibrofatty tissue. The largest level II node was adherent to the vein but was able to dissected off. The vagus and the carotid were all intact and preserved. The superior limit of dissection was the submandibular gland and the underlying more posterior spinal accessory nerve. The omohyoid was sacrificed. The deep fascia was all clean and the phrenic nerve was never exposed. The specimen was delivered and sent together with the thyroid for pathologic evaluation. Orientation was not needed. A with saline  and hemostasis was completed using bipolar cautery and additional 4-0 silk ties as needed.   The drain was exited through separate stab incision inferiorly. This was secured  with nylon suture. The midline fascia was reapproximated with interrupted 3-0 chromic chromic suture. The platysma layer was reapproximated with interrupted 3-0 chromic. Staples were used on the skin. The drain was charged and held for good seal. Patient was awakened extubated and transferred to recovery in stable condition.  The drain was secured in place with a nylon suture. The midline fascia was reapproximated with interrupted chromic suture. The platysma layer was also reapproximated with interrupted chromic suture. A running subcuticular closure was accomplished. Dermabond was used on the skin. The drain was charged. The patient was awakened, extubated and transferred to recovery in stable condition.   PATIENT DISPOSITION:  To PACU, stable

## 2017-02-12 NOTE — Anesthesia Preprocedure Evaluation (Signed)
Anesthesia Evaluation  Patient identified by MRN, date of birth, ID band Patient awake    Reviewed: Allergy & Precautions, NPO status , Patient's Chart, lab work & pertinent test results  History of Anesthesia Complications Negative for: history of anesthetic complications  Airway Mallampati: I  TM Distance: >3 FB Neck ROM: Full    Dental  (+) Teeth Intact   Pulmonary sleep apnea and Continuous Positive Airway Pressure Ventilation , former smoker,    breath sounds clear to auscultation       Cardiovascular hypertension, Pt. on medications (-) angina(-) Past MI and (-) CHF  Rhythm:Regular     Neuro/Psych negative neurological ROS  negative psych ROS   GI/Hepatic negative GI ROS, Neg liver ROS,   Endo/Other  Morbid obesityThyroid mass  Renal/GU Renal disease     Musculoskeletal  (+) Arthritis ,   Abdominal   Peds  Hematology negative hematology ROS (+)   Anesthesia Other Findings   Reproductive/Obstetrics                             Anesthesia Physical Anesthesia Plan  ASA: III  Anesthesia Plan: General   Post-op Pain Management:    Induction: Intravenous  PONV Risk Score and Plan: 2 and Ondansetron and Dexamethasone  Airway Management Planned: Oral ETT  Additional Equipment: Arterial line  Intra-op Plan:   Post-operative Plan: Extubation in OR and Possible Post-op intubation/ventilation  Informed Consent: I have reviewed the patients History and Physical, chart, labs and discussed the procedure including the risks, benefits and alternatives for the proposed anesthesia with the patient or authorized representative who has indicated his/her understanding and acceptance.   Dental advisory given  Plan Discussed with: CRNA and Surgeon  Anesthesia Plan Comments:         Anesthesia Quick Evaluation

## 2017-02-12 NOTE — Progress Notes (Signed)
   Subjective:    Patient ID: [name], male    DOB: 03-24-1941, 76 y.o.   MRN: [id number]  HPI Feeling well.  Some throat soreness.  Able to swallow.  Voice seems normal.  Review of Systems     Objective:   Physical Exam AF VSS Alert, NAD. Normal voice. Neck incision clean and intact, no fluid collection.  Drain functioning. CN VII and IX normal.    Assessment & Plan:  Thyroid cancer s/p thyroidectomy with neck dissection Doing well.  Observe overnight.  Calcium level somewhat decreased post-op.  Will start Tums with calcium and recheck in am.  Possible discharge in morning with drain in place.

## 2017-02-12 NOTE — Procedures (Signed)
Placed patient on CPAP for the night.  Patient is tolerating well at this time. 

## 2017-02-12 NOTE — Transfer of Care (Signed)
Immediate Anesthesia Transfer of Care Note  Patient: [name]  Procedure(s) Performed: Procedure(s) with comments: THYROIDECTOMY (N/A) - total RADICAL NECK DISSECTION (Left) - Left modified neck dissection levels 1 through 4  Patient Location: PACU  Anesthesia Type:General  Level of Consciousness: awake, alert , oriented and patient cooperative  Airway & Oxygen Therapy: Patient Spontanous Breathing and Patient connected to nasal cannula oxygen  Post-op Assessment: Report given to RN and Post -op Vital signs reviewed and stable  Post vital signs: Reviewed and stable  Last Vitals:  Vitals:   02/12/17 0802  BP: (!) 151/95  Pulse: (!) 59  Resp: 20  Temp: 37 C    Last Pain:  Vitals:   02/12/17 0802  TempSrc: Oral         Complications: No apparent anesthesia complications

## 2017-02-13 ENCOUNTER — Emergency Department (HOSPITAL_COMMUNITY): Payer: Medicare Other | Admitting: Anesthesiology

## 2017-02-13 ENCOUNTER — Encounter (HOSPITAL_COMMUNITY)
Admission: EM | Disposition: A | Discharge status: Home / Self Care | Payer: Self-pay | Source: Home / Self Care | Attending: Otolaryngology

## 2017-02-13 ENCOUNTER — Inpatient Hospital Stay (HOSPITAL_COMMUNITY)
Admission: EM | Admit: 2017-02-13 | Discharge: 2017-02-15 | DRG: 909 | Disposition: A | Discharge status: Home / Self Care | Payer: Medicare Other | Attending: Otolaryngology | Admitting: Otolaryngology

## 2017-02-13 ENCOUNTER — Encounter (HOSPITAL_COMMUNITY): Payer: Self-pay | Admitting: Emergency Medicine

## 2017-02-13 DIAGNOSIS — L7632 Postprocedural hematoma of skin and subcutaneous tissue following other procedure: Principal | ICD-10-CM | POA: Diagnosis present

## 2017-02-13 DIAGNOSIS — Z87891 Personal history of nicotine dependence: Secondary | ICD-10-CM

## 2017-02-13 DIAGNOSIS — Z807 Family history of other malignant neoplasms of lymphoid, hematopoietic and related tissues: Secondary | ICD-10-CM

## 2017-02-13 DIAGNOSIS — Y838 Other surgical procedures as the cause of abnormal reaction of the patient, or of later complication, without mention of misadventure at the time of the procedure: Secondary | ICD-10-CM | POA: Diagnosis present

## 2017-02-13 DIAGNOSIS — Z8546 Personal history of malignant neoplasm of prostate: Secondary | ICD-10-CM

## 2017-02-13 DIAGNOSIS — I10 Essential (primary) hypertension: Secondary | ICD-10-CM | POA: Diagnosis present

## 2017-02-13 DIAGNOSIS — C73 Malignant neoplasm of thyroid gland: Secondary | ICD-10-CM | POA: Diagnosis present

## 2017-02-13 DIAGNOSIS — S1093XA Contusion of unspecified part of neck, initial encounter: Secondary | ICD-10-CM | POA: Diagnosis present

## 2017-02-13 DIAGNOSIS — G4730 Sleep apnea, unspecified: Secondary | ICD-10-CM | POA: Diagnosis present

## 2017-02-13 DIAGNOSIS — Z885 Allergy status to narcotic agent status: Secondary | ICD-10-CM

## 2017-02-13 DIAGNOSIS — M1990 Unspecified osteoarthritis, unspecified site: Secondary | ICD-10-CM | POA: Diagnosis present

## 2017-02-13 DIAGNOSIS — Z800 Family history of malignant neoplasm of digestive organs: Secondary | ICD-10-CM

## 2017-02-13 DIAGNOSIS — R221 Localized swelling, mass and lump, neck: Secondary | ICD-10-CM | POA: Diagnosis not present

## 2017-02-13 HISTORY — PX: HEMATOMA EVACUATION: SHX5118

## 2017-02-13 LAB — CALCIUM: CALCIUM: 8.4 mg/dL — AB (ref 8.9–10.3)

## 2017-02-13 SURGERY — EVACUATION HEMATOMA
Anesthesia: General

## 2017-02-13 MED ORDER — PROPOFOL 10 MG/ML IV BOLUS
INTRAVENOUS | Status: DC | PRN
  Administered 2017-02-13: 160 mg via INTRAVENOUS

## 2017-02-13 MED ORDER — MIDAZOLAM HCL 2 MG/2ML IJ SOLN
INTRAMUSCULAR | Status: AC
  Filled 2017-02-13: qty 2

## 2017-02-13 MED ORDER — PROPOFOL 10 MG/ML IV BOLUS
INTRAVENOUS | Status: AC
  Filled 2017-02-13: qty 20

## 2017-02-13 MED ORDER — FENTANYL CITRATE (PF) 250 MCG/5ML IJ SOLN
INTRAMUSCULAR | Status: AC
  Filled 2017-02-13: qty 5

## 2017-02-13 MED ORDER — SUCCINYLCHOLINE CHLORIDE 20 MG/ML IJ SOLN
INTRAMUSCULAR | Status: DC | PRN
  Administered 2017-02-13: 100 mg via INTRAVENOUS

## 2017-02-13 MED ORDER — MIDAZOLAM HCL 5 MG/5ML IJ SOLN
INTRAMUSCULAR | Status: DC | PRN
  Administered 2017-02-13: 1 mg via INTRAVENOUS

## 2017-02-13 MED ORDER — LIDOCAINE HCL (CARDIAC) 20 MG/ML IV SOLN
INTRAVENOUS | Status: DC | PRN
  Administered 2017-02-13: 60 mg via INTRATRACHEAL

## 2017-02-13 MED ORDER — FENTANYL CITRATE (PF) 250 MCG/5ML IJ SOLN
INTRAMUSCULAR | Status: DC | PRN
  Administered 2017-02-13: 100 ug via INTRAVENOUS

## 2017-02-13 MED ORDER — HYDROCODONE-ACETAMINOPHEN 5-325 MG PO TABS: 1.0000 | ORAL_TABLET | Freq: Four times a day (QID) | ORAL | 0 refills | Status: AC | PRN

## 2017-02-13 MED ORDER — CEFAZOLIN SODIUM-DEXTROSE 2-3 GM-% IV SOLR
INTRAVENOUS | Status: DC | PRN
  Administered 2017-02-13: 2 g via INTRAVENOUS

## 2017-02-13 MED ORDER — LACTATED RINGERS IV SOLN
INTRAVENOUS | Status: DC | PRN
  Administered 2017-02-13: via INTRAVENOUS

## 2017-02-13 SURGICAL SUPPLY — 30 items
APL SKNCLS STERI-STRIP NONHPOA (GAUZE/BANDAGES/DRESSINGS) ×1
BENZOIN TINCTURE PRP APPL 2/3 (GAUZE/BANDAGES/DRESSINGS) ×2 IMPLANT
CANISTER SUCT 3000ML PPV (MISCELLANEOUS) IMPLANT
CLEANER TIP ELECTROSURG 2X2 (MISCELLANEOUS) ×3 IMPLANT
COVER SURGICAL LIGHT HANDLE (MISCELLANEOUS) ×3 IMPLANT
CRADLE DONUT ADULT HEAD (MISCELLANEOUS) IMPLANT
DRAPE HALF SHEET 40X57 (DRAPES) IMPLANT
ELECT COATED BLADE 2.86 ST (ELECTRODE) ×3 IMPLANT
ELECT NDL TIP 2.8 STRL (NEEDLE) IMPLANT
ELECT NEEDLE TIP 2.8 STRL (NEEDLE) IMPLANT
ELECT REM PT RETURN 9FT ADLT (ELECTROSURGICAL) ×3
ELECTRODE REM PT RTRN 9FT ADLT (ELECTROSURGICAL) ×1 IMPLANT
EVACUATOR SILICONE 100CC (DRAIN) ×2 IMPLANT
GAUZE SPONGE 4X4 12PLY STRL (GAUZE/BANDAGES/DRESSINGS) IMPLANT
GLOVE BIO SURGEON STRL SZ7.5 (GLOVE) ×3 IMPLANT
GOWN STRL REUS W/ TWL LRG LVL3 (GOWN DISPOSABLE) ×2 IMPLANT
GOWN STRL REUS W/TWL LRG LVL3 (GOWN DISPOSABLE) ×6
KIT BASIN OR (CUSTOM PROCEDURE TRAY) ×3 IMPLANT
KIT ROOM TURNOVER OR (KITS) IMPLANT
NS IRRIG 1000ML POUR BTL (IV SOLUTION) ×3 IMPLANT
PAD ARMBOARD 7.5X6 YLW CONV (MISCELLANEOUS) ×6 IMPLANT
PENCIL BUTTON HOLSTER BLD 10FT (ELECTRODE) ×3 IMPLANT
STAPLER VISISTAT 35W (STAPLE) ×2 IMPLANT
SUT SILK 3 0 (SUTURE) ×3
SUT SILK 3-0 18XBRD TIE 12 (SUTURE) IMPLANT
SUT VIC AB 3-0 SH 27 (SUTURE) ×3
SUT VIC AB 3-0 SH 27X BRD (SUTURE) IMPLANT
SYR BULB IRRIGATION 50ML (SYRINGE) ×2 IMPLANT
TRAY ENT MC OR (CUSTOM PROCEDURE TRAY) ×3 IMPLANT
YANKAUER SUCT BULB TIP NO VENT (SUCTIONS) ×2 IMPLANT

## 2017-02-13 NOTE — Progress Notes (Signed)
Pt for discharge going home with his wife at the bedside, instructed how to empty the JP drain and record, health teachings given, next appointment, prescription for pain meds given, removed the IV line and given all his personal belongings, no complain of pain.

## 2017-02-13 NOTE — Discharge Summary (Signed)
Physician Discharge Summary  Patient ID: [name] MRN: [id number] DOB/AGE: Mar 23, 1941 76 y.o.  Admit date: 02/12/2017 Discharge date: 02/13/2017  Admission Diagnoses: Thyroid cancer  Discharge Diagnoses:  Active Problems:   Thyroid cancer [facility])   Discharged Condition: good  Hospital Course: 76 year old male underwent thyroidectomy and neck dissection for thyroid cancer.  See operative note.  He was observed overnight with drain in place and did well.  His calcium level came down somewhat and oral calcium supplementation was initiated.  On POD 1, he feels good and is felt stable for discharge with drain in place.  Consults: None  Significant Diagnostic Studies: None  Treatments: surgery: Total thyroidectomy with left selective neck dissection  Discharge Exam: Blood pressure (!) 117/53, pulse 78, temperature 98.1 F (36.7 C), temperature source Oral, resp. rate 18, height 6' (1.829 m), weight 245 lb 14.4 oz (111.5 kg), SpO2 96 %. General appearance: alert, cooperative and no distress Neck: neck incision clean and intact, staples in place, no fluid collection, drain functioning, normal voice  Disposition:   Discharge Instructions    Diet - low sodium heart healthy    Complete by:  As directed    Discharge instructions    Complete by:  As directed    Resume regular diet.  Avoid strenuous activity.  Apply Bacitracin or Neosporin to incision twice daily.  Record drain output and recharge bulb three times per day.  Take OsCal 500 mg, 2 tablets three times daily.  If you feel increasing tingling and muscle cramping, take more OsCal pills and call Dr. Janeice Robinson office.   Increase activity slowly    Complete by:  As directed      Allergies as of 02/13/2017      Reactions   Codeine Other (See Comments)   Pt states makes eye feel weird      Medication List    TAKE these medications   acetaminophen 650 MG CR tablet Commonly known as:  TYLENOL Take 1,300 mg by mouth 2  (two) times daily.   aspirin EC 81 MG tablet Take 81 mg by mouth daily.   HYDROcodone-acetaminophen 5-325 MG tablet Commonly known as:  NORCO/VICODIN Take 1-2 tablets by mouth every 6 (six) hours as needed for moderate pain.   levothyroxine 100 MCG tablet Commonly known as:  SYNTHROID Take 1 tablet (100 mcg total) by mouth daily.   lisinopril 20 MG tablet Commonly known as:  PRINIVIL,ZESTRIL Take 20 mg by mouth daily.   simvastatin 40 MG tablet Commonly known as:  ZOCOR Take 40 mg by mouth daily at 6 PM.   timolol 0.5 % ophthalmic solution Commonly known as:  BETIMOL Place 1 drop into both eyes daily.      Follow-up Information    Izora Gala, MD. Schedule an appointment as soon as possible for a visit on 02/15/2017.   Specialty:  Otolaryngology Contact information: [address] [place] [address] [phone]           Signed: Melida Quitter 02/13/2017, 8:19 AM

## 2017-02-13 NOTE — Anesthesia Procedure Notes (Signed)
Procedure Name: Intubation Date/Time: 02/13/2017 11:48 PM Performed by: Maude Leriche D Pre-anesthesia Checklist: Patient identified, Emergency Drugs available, Suction available, Patient being monitored and Timeout performed Patient Re-evaluated:Patient Re-evaluated prior to inductionOxygen Delivery Method: Circle system utilized Preoxygenation: Pre-oxygenation with 100% oxygen Intubation Type: IV induction, Rapid sequence and Cricoid Pressure applied Laryngoscope Size: Glidescope Grade View: Grade I Tube type: Subglottic suction tube Tube size: 7.5 mm Number of attempts: 1 Airway Equipment and Method: Stylet and Video-laryngoscopy Placement Confirmation: ETT inserted through vocal cords under direct vision,  positive ETCO2 and breath sounds checked- equal and bilateral Secured at: 23 cm Tube secured with: Tape Dental Injury: Teeth and Oropharynx as per pre-operative assessment

## 2017-02-13 NOTE — Anesthesia Preprocedure Evaluation (Signed)
Anesthesia Evaluation  Patient identified by MRN, date of birth, ID band Patient awake    Reviewed: Allergy & Precautions, H&P , NPO status , Patient's Chart, lab work & pertinent test results  Airway        Dental no notable dental hx.    Pulmonary sleep apnea and Continuous Positive Airway Pressure Ventilation , former smoker,    Pulmonary exam normal        Cardiovascular hypertension, Pt. on medications      Neuro/Psych negative neurological ROS  negative psych ROS   GI/Hepatic negative GI ROS, Neg liver ROS,   Endo/Other  negative endocrine ROS  Renal/GU negative Renal ROS  negative genitourinary   Musculoskeletal  (+) Arthritis , Osteoarthritis,    Abdominal   Peds  Hematology negative hematology ROS (+)   Anesthesia Other Findings   Reproductive/Obstetrics negative OB ROS                             Anesthesia Physical Anesthesia Plan  ASA: III and emergent  Anesthesia Plan: General   Post-op Pain Management:    Induction: Intravenous  PONV Risk Score and Plan: 4 or greater and Ondansetron, Dexamethasone, Propofol and Treatment may vary due to age or medical condition  Airway Management Planned: Oral ETT and Video Laryngoscope Planned  Additional Equipment:   Intra-op Plan:   Post-operative Plan: Extubation in OR  Informed Consent: I have reviewed the patients History and Physical, chart, labs and discussed the procedure including the risks, benefits and alternatives for the proposed anesthesia with the patient or authorized representative who has indicated his/her understanding and acceptance.   Dental advisory given  Plan Discussed with: CRNA  Anesthesia Plan Comments:         Anesthesia Quick Evaluation

## 2017-02-13 NOTE — H&P (Signed)
[name] is an 76 y.o. male.   Chief Complaint: Neck swelling HPI: 76 year old male underwent total thyroidectomy with left neck dissection yesterday by Dr. Constance Holster for thyroid cancer.  He was observed overnight and did well.  He wished to be discharged home this morning with the drain in place.  Since being home, he began noticing more swelling of the left neck that has worsened through the day.  Fluid in the drain looks more like blood and has increased in output.  He feels difficulty swallowing but no difficulty breathing.  Past Medical History:  Diagnosis Date  . Arthritis   . High cholesterol   . Hypertension   . 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 Prostate CA ([place])   . Sleep apnea    uses CPAP  . Thyroid cancer ([place]) 02/10/2017    Past Surgical History:  Procedure Laterality Date  . BACK SURGERY    . CARDIAC CATHETERIZATION  2002  . HERNIA REPAIR    . PROSTATE SURGERY  2001  . THYROIDECTOMY  02/12/2017  . TONSILLECTOMY      Family History  Problem Relation Age of Onset  . Lymphoma Mother   . Pancreatic cancer Father    Social History:  reports that he quit smoking about 45 years ago. His smoking use included Cigarettes. He has never used smokeless tobacco. He reports that he drinks alcohol. He reports that he does not use drugs.  Allergies:  Allergies  Allergen Reactions  . Codeine Other (See Comments)    Pt states makes eye feel weird     (Not in a hospital admission)  Results for orders placed or performed during the hospital encounter of 02/12/17 (from the past 48 hour(s))  Calcium     Status: Abnormal   Collection Time: 02/12/17  3:54 PM  Result Value Ref Range   Calcium 8.6 (L) 8.9 - 10.3 mg/dL  Calcium     Status: Abnormal   Collection Time: 02/12/17 10:43 PM  Result Value Ref Range   Calcium 8.5 (L) 8.9 - 10.3 mg/dL  Calcium     Status: Abnormal   Collection Time: 02/13/17  7:02 AM  Result Value Ref Range   Calcium 8.4 (L) 8.9 - 10.3  mg/dL   No results found.  Review of Systems  All other systems reviewed and are negative.   Blood pressure (!) 164/95, pulse 71, temperature 98.5 F (36.9 C), temperature source Oral, resp. rate 18, SpO2 97 %. Physical Exam  Constitutional: He is oriented to person, place, and time. He appears well-developed and well-nourished. No distress.  HENT:  Head: Normocephalic and atraumatic.  Right Ear: External ear normal.  Left Ear: External ear normal.  Nose: Nose normal.  Mouth/Throat: Oropharynx is clear and moist.  Eyes: Conjunctivae and EOM are normal. Pupils are equal, round, and reactive to light.  Neck:  Neck incision intact with staples in place.  Drain functioning with blood/clot in drain.  Left neck with marked swelling with large fluid pocket, extends posteriorly and toward shoulder.  Normal voice.  No stridor.  Cardiovascular: Normal rate.   Respiratory: Effort normal.  Musculoskeletal: Normal range of motion.  Neurological: He is alert and oriented to person, place, and time. No cranial nerve deficit.  Skin: Skin is warm and dry.  Psychiatric: He has a normal mood and affect. His behavior is normal. Judgment and thought content normal.     Assessment/Plan Left neck post-operative hematoma I recommended surgical evacuation  of the hematoma with possible drain replacement.  Risks, benefits, and alternatives were discussed and he expressed understanding and agreement.  He will be observed overnight afterwards.  Melida Quitter, MD 02/13/2017, 11:03 PM

## 2017-02-13 NOTE — Anesthesia Preprocedure Evaluation (Addendum)
Anesthesia Evaluation  Patient identified by MRN, date of birth, ID band Patient awake    Reviewed: Allergy & Precautions, H&P , NPO status , Patient's Chart, lab work & pertinent test results  Airway Mallampati: II  TM Distance: >3 FB Neck ROM: Limited    Dental no notable dental hx. (+) Teeth Intact, Dental Advisory Given   Pulmonary sleep apnea and Continuous Positive Airway Pressure Ventilation , former smoker,    Pulmonary exam normal breath sounds clear to auscultation       Cardiovascular hypertension, Pt. on medications  Rhythm:Regular Rate:Normal     Neuro/Psych negative neurological ROS  negative psych ROS   GI/Hepatic negative GI ROS, Neg liver ROS,   Endo/Other  negative endocrine ROS  Renal/GU negative Renal ROS  negative genitourinary   Musculoskeletal   Abdominal   Peds  Hematology negative hematology ROS (+)   Anesthesia Other Findings   Reproductive/Obstetrics negative OB ROS                            Anesthesia Physical Anesthesia Plan  ASA: III and emergent  Anesthesia Plan: General   Post-op Pain Management:    Induction: Intravenous, Cricoid pressure planned and Rapid sequence  PONV Risk Score and Plan: 4 or greater and Ondansetron, Dexamethasone, Propofol and Midazolam  Airway Management Planned: Oral ETT  Additional Equipment:   Intra-op Plan:   Post-operative Plan: Extubation in OR  Informed Consent: I have reviewed the patients History and Physical, chart, labs and discussed the procedure including the risks, benefits and alternatives for the proposed anesthesia with the patient or authorized representative who has indicated his/her understanding and acceptance.   Dental advisory given  Plan Discussed with: CRNA, Anesthesiologist and Surgeon  Anesthesia Plan Comments:        Anesthesia Quick Evaluation

## 2017-02-13 NOTE — ED Triage Notes (Addendum)
Pt presents with significant swelling to incision site to anterior neck, extending around to posterior neck on L side.  Pt d/c yesterday from this facility yesterday 9am s/p thyroidectomy Dr. Redmond Baseman at bedside, pt to go straight to OR Pt c/o difficulty swallowing, JP drain nearly full of bright red blood

## 2017-02-14 ENCOUNTER — Encounter (HOSPITAL_COMMUNITY): Payer: Self-pay | Admitting: Otolaryngology

## 2017-02-14 DIAGNOSIS — S1093XA Contusion of unspecified part of neck, initial encounter: Secondary | ICD-10-CM | POA: Diagnosis present

## 2017-02-14 DIAGNOSIS — G4730 Sleep apnea, unspecified: Secondary | ICD-10-CM | POA: Diagnosis present

## 2017-02-14 DIAGNOSIS — R221 Localized swelling, mass and lump, neck: Secondary | ICD-10-CM | POA: Diagnosis present

## 2017-02-14 DIAGNOSIS — I10 Essential (primary) hypertension: Secondary | ICD-10-CM | POA: Diagnosis present

## 2017-02-14 DIAGNOSIS — Y838 Other surgical procedures as the cause of abnormal reaction of the patient, or of later complication, without mention of misadventure at the time of the procedure: Secondary | ICD-10-CM | POA: Diagnosis present

## 2017-02-14 DIAGNOSIS — Z8546 Personal history of malignant neoplasm of prostate: Secondary | ICD-10-CM | POA: Diagnosis not present

## 2017-02-14 DIAGNOSIS — Z800 Family history of malignant neoplasm of digestive organs: Secondary | ICD-10-CM | POA: Diagnosis not present

## 2017-02-14 DIAGNOSIS — Z87891 Personal history of nicotine dependence: Secondary | ICD-10-CM | POA: Diagnosis not present

## 2017-02-14 DIAGNOSIS — L7632 Postprocedural hematoma of skin and subcutaneous tissue following other procedure: Secondary | ICD-10-CM | POA: Diagnosis present

## 2017-02-14 DIAGNOSIS — M1990 Unspecified osteoarthritis, unspecified site: Secondary | ICD-10-CM | POA: Diagnosis present

## 2017-02-14 DIAGNOSIS — Z885 Allergy status to narcotic agent status: Secondary | ICD-10-CM | POA: Diagnosis not present

## 2017-02-14 DIAGNOSIS — C73 Malignant neoplasm of thyroid gland: Secondary | ICD-10-CM | POA: Diagnosis present

## 2017-02-14 DIAGNOSIS — Z807 Family history of other malignant neoplasms of lymphoid, hematopoietic and related tissues: Secondary | ICD-10-CM | POA: Diagnosis not present

## 2017-02-14 MED ORDER — HYDROCODONE-ACETAMINOPHEN 5-325 MG PO TABS
1.0000 | ORAL_TABLET | Freq: Four times a day (QID) | ORAL | Status: DC | PRN
  Administered 2017-02-14 (×2): 2 via ORAL
  Filled 2017-02-14 (×3): qty 2

## 2017-02-14 MED ORDER — LISINOPRIL 20 MG PO TABS
20.0000 mg | ORAL_TABLET | Freq: Every day | ORAL | Status: DC
  Administered 2017-02-14 – 2017-02-15 (×2): 20 mg via ORAL
  Filled 2017-02-14 (×2): qty 1

## 2017-02-14 MED ORDER — CEFAZOLIN SODIUM-DEXTROSE 1-4 GM/50ML-% IV SOLN
1.0000 g | Freq: Three times a day (TID) | INTRAVENOUS | Status: AC
  Administered 2017-02-14 (×3): 1 g via INTRAVENOUS
  Filled 2017-02-14 (×3): qty 50

## 2017-02-14 MED ORDER — SIMVASTATIN 40 MG PO TABS
40.0000 mg | ORAL_TABLET | Freq: Every day | ORAL | Status: DC
  Administered 2017-02-14: 40 mg via ORAL
  Filled 2017-02-14: qty 1

## 2017-02-14 MED ORDER — BACITRACIN ZINC 500 UNIT/GM EX OINT
TOPICAL_OINTMENT | CUTANEOUS | Status: AC
  Filled 2017-02-14: qty 28.35

## 2017-02-14 MED ORDER — MORPHINE SULFATE (PF) 4 MG/ML IV SOLN
2.0000 mg | INTRAVENOUS | Status: DC | PRN
  Filled 2017-02-14: qty 1

## 2017-02-14 MED ORDER — KCL IN DEXTROSE-NACL 20-5-0.45 MEQ/L-%-% IV SOLN
INTRAVENOUS | Status: DC
  Administered 2017-02-14 (×2): via INTRAVENOUS
  Filled 2017-02-14 (×3): qty 1000

## 2017-02-14 MED ORDER — FENTANYL CITRATE (PF) 100 MCG/2ML IJ SOLN: 25.0000 ug | INTRAMUSCULAR | Status: DC | PRN

## 2017-02-14 MED ORDER — BACITRACIN ZINC 500 UNIT/GM EX OINT
1.0000 "application " | TOPICAL_OINTMENT | Freq: Two times a day (BID) | CUTANEOUS | Status: DC
  Administered 2017-02-14 – 2017-02-15 (×3): 1 via TOPICAL

## 2017-02-14 MED ORDER — TIMOLOL MALEATE 0.5 % OP SOLN
1.0000 [drp] | Freq: Every day | OPHTHALMIC | Status: DC
  Administered 2017-02-14 – 2017-02-15 (×2): 1 [drp] via OPHTHALMIC
  Filled 2017-02-14: qty 5

## 2017-02-14 MED ORDER — LEVOTHYROXINE SODIUM 100 MCG PO TABS
100.0000 ug | ORAL_TABLET | Freq: Every day | ORAL | Status: DC
  Administered 2017-02-14 – 2017-02-15 (×2): 100 ug via ORAL
  Filled 2017-02-14 (×2): qty 1

## 2017-02-14 MED ORDER — ONDANSETRON HCL 4 MG PO TABS: 4.0000 mg | ORAL_TABLET | ORAL | Status: DC | PRN

## 2017-02-14 MED ORDER — CALCIUM CARBONATE-VITAMIN D 500-200 MG-UNIT PO TABS
2.0000 | ORAL_TABLET | Freq: Three times a day (TID) | ORAL | Status: DC
  Administered 2017-02-14 – 2017-02-15 (×4): 2 via ORAL
  Filled 2017-02-14 (×4): qty 2

## 2017-02-14 MED ORDER — ONDANSETRON HCL 4 MG/2ML IJ SOLN: 4.0000 mg | INTRAMUSCULAR | Status: DC | PRN

## 2017-02-14 NOTE — Transfer of Care (Signed)
Immediate Anesthesia Transfer of Care Note  Patient: [name]  Procedure(s) Performed: Procedure(s): EVACUATION HEMATOMA, NECK (N/A)  Patient Location: PACU  Anesthesia Type:General  Level of Consciousness: awake  Airway & Oxygen Therapy: Patient Spontanous Breathing and Patient connected to face mask oxygen  Post-op Assessment: Report given to RN and Post -op Vital signs reviewed and stable  Post vital signs: Reviewed and stable  Last Vitals:  Vitals:   02/13/17 2250 02/13/17 2302  BP: (!) 164/95 (!) 179/103  Pulse: 71 71  Resp: 18 16  Temp: 36.9 C 37 C    Last Pain:  Vitals:   02/13/17 2304  TempSrc:   PainSc: 2          Complications: No apparent anesthesia complications

## 2017-02-14 NOTE — Anesthesia Postprocedure Evaluation (Signed)
Anesthesia Post Note  Patient: [name]  Procedure(s) Performed: Procedure(s) (LRB): EVACUATION HEMATOMA, NECK (N/A)     Patient location during evaluation: PACU Anesthesia Type: General Level of consciousness: awake and alert Pain management: pain level controlled Vital Signs Assessment: post-procedure vital signs reviewed and stable Respiratory status: spontaneous breathing, nonlabored ventilation, respiratory function stable and patient connected to nasal cannula oxygen Cardiovascular status: blood pressure returned to baseline and stable Postop Assessment: no signs of nausea or vomiting Anesthetic complications: no    Last Vitals:  Vitals:   02/14/17 0115 02/14/17 0129  BP:  (!) 165/96  Pulse:    Resp:  20  Temp: 36.4 C 36.7 C    Last Pain:  Vitals:   02/14/17 0129  TempSrc:   PainSc: 3                  Caylie Sandquist,W. EDMOND

## 2017-02-14 NOTE — Progress Notes (Signed)
Patient placed on CPAP for the evening without complication. RT will continue to monitor as needed. 

## 2017-02-14 NOTE — Progress Notes (Signed)
   Subjective:    Patient ID: [name], male    DOB: [birth year], 76 y.o.   MRN: [id number]  HPI Feeling better.  Notices some difficulty swallowing.  Review of Systems     Objective:   Physical Exam AF VSS, drain 150 cc Alert, NAD Neck incision clean and intact, no fluid collection, drain functioning Normal voice    Assessment & Plan:  Thyroid cancer s/p total thyroidectomy and neck dissection returned to hospital last night with post-surgical hematoma requiring evacuation  Doing well.  Drain functioning.  Swallow likely affected by hematoma event and resulting edema, should improve with time.  Will observe overnight again tonight.

## 2017-02-14 NOTE — Brief Op Note (Signed)
02/13/2017 - 02/14/2017  12:25 AM  PATIENT:  Consuello Bossier  76 y.o. male  PRE-OPERATIVE DIAGNOSIS:  neck hematoma  POST-OPERATIVE DIAGNOSIS:  neck hematoma  PROCEDURE:  Procedure(s): EVACUATION HEMATOMA, NECK (N/A)  SURGEON:  Surgeon(s) and Role:    Melida Quitter, MD - Primary  PHYSICIAN ASSISTANT:   ASSISTANTS: none   ANESTHESIA:   general  EBL:  Total I/O In: 700 [I.V.:700] Out: 100 [Blood:100]  BLOOD ADMINISTERED:none  DRAINS: (15 Fr) Jackson-Pratt drain(s) with closed bulb suction in the neck   LOCAL MEDICATIONS USED:  NONE  SPECIMEN:  No Specimen  DISPOSITION OF SPECIMEN:  N/A  COUNTS:  YES  TOURNIQUET:  * No tourniquets in log *  DICTATION: .Other Dictation: Dictation Number [id number]  PLAN OF CARE: Admit for overnight observation  PATIENT DISPOSITION:  PACU - hemodynamically stable.   Delay start of Pharmacological VTE agent (>24hrs) due to surgical blood loss or risk of bleeding: yes

## 2017-02-14 NOTE — Progress Notes (Addendum)
Dr. Ola Spurr at bedside. States pt has hx of sleep apnea. Asked for pt to have oximetry and CO2 monitoring thru night. Applied monitoring equipment to IV pump ETCO2 37 O2 Sat continues at 98-100 on 2L [place]

## 2017-02-15 ENCOUNTER — Encounter (HOSPITAL_COMMUNITY): Payer: Self-pay | Admitting: Otolaryngology

## 2017-02-15 NOTE — Progress Notes (Signed)
Discharge paperwork reviewed with patient. No questions verbalized. Patient is ready for discharge.  

## 2017-02-15 NOTE — Op Note (Signed)
NAME:  [name], [name] NO.:  [id number]  MEDICAL RECORD NO.:  [id number]  LOCATION:                                 FACILITY:  PHYSICIAN:  Onnie Graham, MD     DATE OF BIRTH:  1941/02/21  DATE OF PROCEDURE:  02/13/2017 DATE OF DISCHARGE:                              OPERATIVE REPORT   PREOPERATIVE DIAGNOSIS:  Postoperative neck hematoma.  POSTOPERATIVE DIAGNOSIS:  Postoperative neck hematoma.  PROCEDURE:  Evacuation of neck hematoma.  SURGEON:  Onnie Graham, MD.  ANESTHESIA:  General endotracheal anesthesia.  COMPLICATIONS:  None.  INDICATION:  The patient is a 76 year old male who underwent total thyroidectomy and left neck dissection yesterday for thyroid cancer. This morning, he was doing quite well after surgery and wished to be discharged with a drain in place.  Since going home, however, he has noticed increasing swelling of the neck and came back to the emergency department because of this problem.  He was found to have a sizable fluid collection in spite of the drain being in place and functioning. He presents to the operating room for surgical management.  FINDINGS:  Upon opening the neck, there was a large hematoma filling the surgical site.  After evacuation of the hematoma, a bleeding site was found along the cervical plexus region and this was ligated.  DESCRIPTION OF PROCEDURE:  The patient was identified in the holding room and informed consent having been obtained including discussion of risks, benefits, alternatives, the patient was brought to the operative suite and put on the operative table in supine position.  Anesthesia was induced and the patient was intubated by the Anesthesia team without difficulty.  The patient was given intravenous antibiotics during the case.  The eyes taped closed and the staples were removed from the neck. The drain was also removed.  The neck was prepped and draped in a sterile fashion.  The wound  was fully opened by cutting the platysma layer sutures.  Hematoma was then medially encountered and was then cleared out of the neck with finger dissection and suction as well as use of gauze.  The anatomy of the neck dissection was then carefully examined and a bleeding site was identified along the cervical plexus in the depth of the wound.  This was controlled with ligation.  Some additional bleeding was controlled with Bovie electrocautery closer to the thyroid bed region.  The patient was then given Valsalva and the areas were carefully examined and no additional bleeding was seen.  The wound was copiously irrigated with saline.  A new 15-French drain was placed and secured to the skin using 2-0 nylon suture in a standard drain stitch.  The wound was then closed back up again using 3-0 Vicryl suture in a simple running fashion in the platysma layer and staples in the skin. Bacitracin ointment was added to the incision.  The patient was cleaned off.  Pressure was applied to the neck during wake-up and extubation. He was then moved to recovery room in stable condition.     Onnie Graham, MD   ______________________________ Onnie Graham, MD    DDB/MEDQ  D:  02/14/2017  T:  02/14/2017  Job:  [id number]  cc:   Onnie Graham, MD's office

## 2017-02-15 NOTE — Discharge Instructions (Signed)
Empty the drain 3 times daily and re-charge. Call if there is any additional problems.

## 2017-02-15 NOTE — Discharge Summary (Signed)
Physician Discharge Summary  Patient ID: [name] MRN: [id number] DOB/AGE: 10/22/1940 76 y.o.  Admit date: 02/13/2017 Discharge date: 02/15/2017  Admission Diagnoses:postoperative hematoma  Discharge Diagnoses:  Active Problems:   Hematoma of neck   Discharged Condition: good  Hospital Course: admitted late Saturday night. Underwent surgical evacuation of hematoma and ligation of vessel. Has done well since then.  Consults: none  Significant Diagnostic Studies: none  Treatments: surgery: evacuation of hematoma and ligation of vessel.  Discharge Exam: Blood pressure (!) 169/91, pulse 72, temperature 98 F (36.7 C), temperature source Oral, resp. rate 18, height 6' (1.829 m), weight 111.1 kg (245 lb), SpO2 96 %. PHYSICAL EXAM: He is awake and alert. Drain is functioning. Incision looks excellent. Flaps are down without any swelling.  Disposition: 01-Home or Self Care  Discharge Instructions    Diet - low sodium heart healthy    Complete by:  As directed    Increase activity slowly    Complete by:  As directed      Allergies as of 02/15/2017      Reactions   Codeine Other (See Comments)   Makes the patient "feel weird"      Medication List    TAKE these medications   acetaminophen 650 MG CR tablet Commonly known as:  TYLENOL Take 1,300 mg by mouth 2 (two) times daily.   aspirin EC 81 MG tablet Take 81 mg by mouth daily.   FISH OIL ULTRA PO Take 1 capsule by mouth daily.   HYDROcodone-acetaminophen 5-325 MG tablet Commonly known as:  NORCO/VICODIN Take 1-2 tablets by mouth every 6 (six) hours as needed for moderate pain.   levothyroxine 100 MCG tablet Commonly known as:  SYNTHROID Take 1 tablet (100 mcg total) by mouth daily.   lisinopril 20 MG tablet Commonly known as:  PRINIVIL,ZESTRIL Take 20 mg by mouth daily.   ONE-A-DAY MENS 50+ ADVANTAGE Tabs Take 1 tablet by mouth daily.   simvastatin 40 MG tablet Commonly known as:  ZOCOR Take 40  mg by mouth daily at 6 PM.   timolol 0.5 % ophthalmic solution Commonly known as:  BETIMOL Place 1 drop into both eyes daily.   VITAMIN C PO Take 1 tablet by mouth daily.   VITAMIN D-3 PO Take 1 capsule by mouth daily.      Follow-up Information    Izora Gala, MD. Schedule an appointment as soon as possible for a visit on 02/17/2017.   Specialty:  Otolaryngology Contact information: [address] [place] [address] [phone]           Signed: Izora Gala 02/15/2017, 8:38 AM

## 2017-02-16 NOTE — Anesthesia Postprocedure Evaluation (Signed)
Anesthesia Post Note  Patient: [name]  Procedure(s) Performed: Procedure(s) (LRB): THYROIDECTOMY (N/A) RADICAL NECK DISSECTION (Left)     Patient location during evaluation: PACU Anesthesia Type: General Level of consciousness: awake and alert Pain management: pain level controlled Vital Signs Assessment: post-procedure vital signs reviewed and stable Respiratory status: spontaneous breathing, nonlabored ventilation, respiratory function stable and patient connected to nasal cannula oxygen Cardiovascular status: blood pressure returned to baseline and stable Postop Assessment: no signs of nausea or vomiting Anesthetic complications: no    Last Vitals:  Vitals:   02/12/17 2325 02/13/17 0521  BP:  (!) 117/53  Pulse: 88 78  Resp: 18 18  Temp:  36.7 C    Last Pain:  Vitals:   02/13/17 0521  TempSrc: Oral  PainSc:                  [name]

## 2017-03-19 ENCOUNTER — Other Ambulatory Visit (HOSPITAL_COMMUNITY): Payer: Self-pay | Admitting: Internal Medicine

## 2017-03-19 DIAGNOSIS — C73 Malignant neoplasm of thyroid gland: Secondary | ICD-10-CM

## 2017-03-22 ENCOUNTER — Encounter (HOSPITAL_COMMUNITY)
Admission: RE | Admit: 2017-03-22 | Discharge: 2017-03-22 | Disposition: A | Discharge status: Home / Self Care | Payer: Medicare Other | Source: Ambulatory Visit | Attending: Internal Medicine | Admitting: Internal Medicine

## 2017-03-22 DIAGNOSIS — C73 Malignant neoplasm of thyroid gland: Secondary | ICD-10-CM | POA: Diagnosis not present

## 2017-03-22 MED ORDER — STERILE WATER FOR INJECTION IJ SOLN
INTRAMUSCULAR | Status: AC
  Filled 2017-03-22: qty 10

## 2017-03-22 MED ORDER — THYROTROPIN ALFA 1.1 MG IM SOLR
0.9000 mg | INTRAMUSCULAR | Status: AC
  Administered 2017-03-22: 0.9 mg via INTRAMUSCULAR

## 2017-03-23 ENCOUNTER — Encounter (HOSPITAL_COMMUNITY)
Admission: RE | Admit: 2017-03-23 | Discharge: 2017-03-23 | Disposition: A | Discharge status: Home / Self Care | Payer: Medicare Other | Source: Ambulatory Visit | Attending: Internal Medicine | Admitting: Internal Medicine

## 2017-03-23 DIAGNOSIS — C73 Malignant neoplasm of thyroid gland: Secondary | ICD-10-CM | POA: Diagnosis not present

## 2017-03-23 MED ORDER — THYROTROPIN ALFA 1.1 MG IM SOLR
0.9000 mg | INTRAMUSCULAR | Status: AC
  Administered 2017-03-23: 0.9 mg via INTRAMUSCULAR

## 2017-03-23 MED ORDER — STERILE WATER FOR INJECTION IJ SOLN
INTRAMUSCULAR | Status: AC
  Filled 2017-03-23: qty 10

## 2017-03-24 ENCOUNTER — Encounter (HOSPITAL_COMMUNITY)
Admission: RE | Admit: 2017-03-24 | Discharge: 2017-03-24 | Disposition: A | Discharge status: Home / Self Care | Payer: Medicare Other | Source: Ambulatory Visit | Attending: Internal Medicine | Admitting: Internal Medicine

## 2017-03-24 DIAGNOSIS — C73 Malignant neoplasm of thyroid gland: Secondary | ICD-10-CM | POA: Diagnosis not present

## 2017-03-24 MED ORDER — SODIUM IODIDE I 131 CAPSULE
147.9000 | Freq: Once | INTRAVENOUS | Status: AC | PRN
  Administered 2017-03-24: 147.9 via ORAL

## 2017-04-02 ENCOUNTER — Encounter (HOSPITAL_COMMUNITY)
Admission: RE | Admit: 2017-04-02 | Discharge: 2017-04-02 | Disposition: A | Discharge status: Home / Self Care | Payer: Medicare Other | Source: Ambulatory Visit | Attending: Internal Medicine | Admitting: Internal Medicine

## 2017-04-02 DIAGNOSIS — C73 Malignant neoplasm of thyroid gland: Secondary | ICD-10-CM | POA: Insufficient documentation

## 2017-04-30 ENCOUNTER — Other Ambulatory Visit: Payer: Self-pay | Admitting: Internal Medicine

## 2017-04-30 DIAGNOSIS — C770 Secondary and unspecified malignant neoplasm of lymph nodes of head, face and neck: Secondary | ICD-10-CM

## 2017-04-30 DIAGNOSIS — C73 Malignant neoplasm of thyroid gland: Secondary | ICD-10-CM

## 2017-06-02 ENCOUNTER — Ambulatory Visit
Admission: RE | Admit: 2017-06-02 | Discharge: 2017-06-02 | Disposition: A | Discharge status: Home / Self Care | Payer: Medicare Other | Source: Ambulatory Visit | Attending: Internal Medicine | Admitting: Internal Medicine

## 2017-06-02 DIAGNOSIS — C770 Secondary and unspecified malignant neoplasm of lymph nodes of head, face and neck: Secondary | ICD-10-CM

## 2017-06-02 DIAGNOSIS — C73 Malignant neoplasm of thyroid gland: Secondary | ICD-10-CM

## 2017-06-07 ENCOUNTER — Other Ambulatory Visit: Payer: Self-pay | Admitting: Internal Medicine

## 2017-06-07 DIAGNOSIS — C73 Malignant neoplasm of thyroid gland: Secondary | ICD-10-CM

## 2017-06-07 DIAGNOSIS — C770 Secondary and unspecified malignant neoplasm of lymph nodes of head, face and neck: Secondary | ICD-10-CM

## 2017-06-10 ENCOUNTER — Ambulatory Visit
Admission: RE | Admit: 2017-06-10 | Discharge: 2017-06-10 | Disposition: A | Discharge status: Home / Self Care | Payer: Medicare Other | Source: Ambulatory Visit | Attending: Internal Medicine | Admitting: Internal Medicine

## 2017-06-10 DIAGNOSIS — C73 Malignant neoplasm of thyroid gland: Secondary | ICD-10-CM

## 2017-06-10 DIAGNOSIS — C770 Secondary and unspecified malignant neoplasm of lymph nodes of head, face and neck: Secondary | ICD-10-CM

## 2017-06-10 MED ORDER — IOPAMIDOL (ISOVUE-300) INJECTION 61%
75.0000 mL | Freq: Once | INTRAVENOUS | Status: AC | PRN
  Administered 2017-06-10: 75 mL via INTRAVENOUS

## 2017-09-27 ENCOUNTER — Other Ambulatory Visit (HOSPITAL_COMMUNITY): Payer: Self-pay | Admitting: Internal Medicine

## 2017-09-27 DIAGNOSIS — C73 Malignant neoplasm of thyroid gland: Secondary | ICD-10-CM

## 2017-10-13 ENCOUNTER — Encounter (HOSPITAL_COMMUNITY)
Admission: RE | Admit: 2017-10-13 | Discharge: 2017-10-13 | Disposition: A | Discharge status: Home / Self Care | Payer: Medicare Other | Source: Ambulatory Visit | Attending: Internal Medicine | Admitting: Internal Medicine

## 2017-10-13 DIAGNOSIS — C73 Malignant neoplasm of thyroid gland: Secondary | ICD-10-CM | POA: Diagnosis not present

## 2017-10-13 MED ORDER — STERILE WATER FOR INJECTION IJ SOLN
INTRAMUSCULAR | Status: AC
  Filled 2017-10-13: qty 10

## 2017-10-13 MED ORDER — THYROTROPIN ALFA 1.1 MG IM SOLR
0.9000 mg | INTRAMUSCULAR | Status: AC
  Administered 2017-10-13: 0.9 mg via INTRAMUSCULAR

## 2017-10-14 ENCOUNTER — Encounter (HOSPITAL_COMMUNITY)
Admission: RE | Admit: 2017-10-14 | Discharge: 2017-10-14 | Disposition: A | Discharge status: Home / Self Care | Payer: Medicare Other | Source: Ambulatory Visit | Attending: Internal Medicine | Admitting: Internal Medicine

## 2017-10-14 DIAGNOSIS — C73 Malignant neoplasm of thyroid gland: Secondary | ICD-10-CM | POA: Diagnosis not present

## 2017-10-14 MED ORDER — STERILE WATER FOR INJECTION IJ SOLN
INTRAMUSCULAR | Status: AC
  Filled 2017-10-14: qty 10

## 2017-10-14 MED ORDER — THYROTROPIN ALFA 1.1 MG IM SOLR
0.9000 mg | INTRAMUSCULAR | Status: AC
  Administered 2017-10-14: 0.9 mg via INTRAMUSCULAR

## 2017-10-15 ENCOUNTER — Encounter (HOSPITAL_COMMUNITY)
Admission: RE | Admit: 2017-10-15 | Discharge: 2017-10-15 | Disposition: A | Discharge status: Home / Self Care | Payer: Medicare Other | Source: Ambulatory Visit | Attending: Internal Medicine | Admitting: Internal Medicine

## 2017-10-15 MED ORDER — SODIUM IODIDE I 131 CAPSULE: 146.0000 | Freq: Once | INTRAVENOUS | Status: DC | PRN

## 2017-10-25 ENCOUNTER — Encounter (HOSPITAL_COMMUNITY)
Admission: RE | Admit: 2017-10-25 | Discharge: 2017-10-25 | Disposition: A | Discharge status: Home / Self Care | Payer: Medicare Other | Source: Ambulatory Visit | Attending: Internal Medicine | Admitting: Internal Medicine

## 2017-10-25 DIAGNOSIS — C73 Malignant neoplasm of thyroid gland: Secondary | ICD-10-CM | POA: Insufficient documentation

## 2018-03-03 ENCOUNTER — Other Ambulatory Visit: Payer: Self-pay | Admitting: Orthopedic Surgery

## 2018-03-04 ENCOUNTER — Other Ambulatory Visit: Payer: Self-pay | Admitting: Orthopedic Surgery

## 2018-03-14 ENCOUNTER — Other Ambulatory Visit: Payer: Self-pay | Admitting: Internal Medicine

## 2018-03-14 DIAGNOSIS — C770 Secondary and unspecified malignant neoplasm of lymph nodes of head, face and neck: Secondary | ICD-10-CM

## 2018-03-14 DIAGNOSIS — C73 Malignant neoplasm of thyroid gland: Secondary | ICD-10-CM

## 2018-03-14 NOTE — Pre-Procedure Instructions (Signed)
[place]  03/14/2018      [facility] - [place] - [address] [address] [address] [address] [place] [address] Phone: [phone] Fax: [phone]    Your procedure is scheduled on Wednesday, July 31st.  Report to [facility] at 10:45 A.M.  Call this number if you have problems the morning of surgery:  [phone]   Remember:  Do not eat or drink after midnight.      Take these medicines the morning of surgery with A SIP OF WATER  acetaminophen (TYLENOL) 650 MG CR tablet levothyroxine (SYNTHROID, LEVOTHROID) 175 MCG tablet timolol (BETIMOL) 0.5 % ophthalmic solution  Follow your doctors instructions regarding your Aspirin.  If no instructions were given by your doctor, then you will need to call the prescribing office office to get instructions.    7 days prior to surgery STOP taking any Aspirin(unless otherwise instructed by your surgeon), Aleve, Naproxen, Ibuprofen, Motrin, Advil, Goody's, BC's, all herbal medications, fish oil, and all vitamins     Do not wear jewelry.  Do not wear lotions, powders, or colognes, or deodorant.  Men may shave face and neck.  Do not bring valuables to the hospital.  [facility] is not responsible for any belongings or valuables.  Contacts, dentures or bridgework may not be worn into surgery.  Leave your suitcase in the car.  After surgery it may be brought to your room.  For patients admitted to the hospital, discharge time will be determined by your treatment team.  Patients discharged the day of surgery will not be allowed to drive home.   Special instructions:   [facility]- Preparing For Surgery  Before surgery, you can play an important role. Because skin is not sterile, your skin needs to be as free of germs as possible. You can reduce the number of germs on your skin by washing with CHG (chlorahexidine gluconate) Soap before surgery.  CHG is an antiseptic  cleaner which kills germs and bonds with the skin to continue killing germs even after washing.    Oral Hygiene is also important to reduce your risk of infection.  Remember - BRUSH YOUR TEETH THE MORNING OF SURGERY WITH YOUR REGULAR TOOTHPASTE  Please do not use if you have an allergy to CHG or antibacterial soaps. If your skin becomes reddened/irritated stop using the CHG.  Do not shave (including legs and underarms) for at least 48 hours prior to first CHG shower. It is OK to shave your face.  Please follow these instructions carefully.   1. Shower the NIGHT BEFORE SURGERY and the MORNING OF SURGERY with CHG.   2. If you chose to wash your hair, wash your hair first as usual with your normal shampoo.  3. After you shampoo, rinse your hair and body thoroughly to remove the shampoo.  4. Use CHG as you would any other liquid soap. You can apply CHG directly to the skin and wash gently with a scrungie or a clean washcloth.   5. Apply the CHG Soap to your body ONLY FROM THE NECK DOWN.  Do not use on open wounds or open sores. Avoid contact with your eyes, ears, mouth and genitals (private parts). Wash Face and genitals (private parts)  with your normal soap.  6. Wash thoroughly, paying special attention to the area where your surgery will be performed.  7. Thoroughly rinse your body with warm water from the neck down.  8. DO NOT shower/wash with your normal soap after using and rinsing off the CHG Soap.  9. Pat yourself dry with a CLEAN TOWEL.  10. Wear CLEAN PAJAMAS to bed the night before surgery, wear comfortable clothes the morning of surgery  11. Place CLEAN SHEETS on your bed the night of your first shower and DO NOT SLEEP WITH PETS.    Day of Surgery:  Do not apply any deodorants/lotions.  Please wear clean clothes to the hospital/surgery center.   Remember to brush your teeth WITH YOUR REGULAR TOOTHPASTE.  Please read over the following fact sheets that you were  given.

## 2018-03-15 ENCOUNTER — Other Ambulatory Visit: Payer: Self-pay

## 2018-03-15 ENCOUNTER — Encounter (HOSPITAL_COMMUNITY)
Admission: RE | Admit: 2018-03-15 | Discharge: 2018-03-15 | Disposition: A | Discharge status: Home / Self Care | Payer: Medicare Other | Source: Ambulatory Visit | Attending: Orthopedic Surgery | Admitting: Orthopedic Surgery

## 2018-03-15 ENCOUNTER — Encounter (HOSPITAL_COMMUNITY): Payer: Self-pay

## 2018-03-15 DIAGNOSIS — I700 Atherosclerosis of aorta: Secondary | ICD-10-CM | POA: Insufficient documentation

## 2018-03-15 DIAGNOSIS — Z01818 Encounter for other preprocedural examination: Secondary | ICD-10-CM | POA: Insufficient documentation

## 2018-03-15 DIAGNOSIS — R9431 Abnormal electrocardiogram [ECG] [EKG]: Secondary | ICD-10-CM | POA: Diagnosis not present

## 2018-03-15 HISTORY — DX: Other specified postprocedural states: R11.2

## 2018-03-15 HISTORY — DX: Other specified postprocedural states: Z98.890

## 2018-03-15 LAB — CBC WITH DIFFERENTIAL/PLATELET
Abs Immature Granulocytes: 0.2 10*3/uL — ABNORMAL HIGH (ref 0.0–0.1)
BASOS ABS: 0.1 10*3/uL (ref 0.0–0.1)
Basophils Relative: 1 %
Eosinophils Absolute: 0.3 10*3/uL (ref 0.0–0.7)
Eosinophils Relative: 3 %
HCT: 45.3 % (ref 39.0–52.0)
HEMOGLOBIN: 14.6 g/dL (ref 13.0–17.0)
IMMATURE GRANULOCYTES: 2 %
LYMPHS ABS: 1.7 10*3/uL (ref 0.7–4.0)
LYMPHS PCT: 19 %
MCH: 29.7 pg (ref 26.0–34.0)
MCHC: 32.2 g/dL (ref 30.0–36.0)
MCV: 92.3 fL (ref 78.0–100.0)
Monocytes Absolute: 1 10*3/uL (ref 0.1–1.0)
Monocytes Relative: 11 %
NEUTROS ABS: 6.1 10*3/uL (ref 1.7–7.7)
NEUTROS PCT: 66 %
Platelets: 232 10*3/uL (ref 150–400)
RBC: 4.91 MIL/uL (ref 4.22–5.81)
RDW: 14 % (ref 11.5–15.5)
WBC: 9.4 10*3/uL (ref 4.0–10.5)

## 2018-03-15 LAB — APTT: APTT: 29 s (ref 24–36)

## 2018-03-15 LAB — SURGICAL PCR SCREEN
MRSA, PCR: NEGATIVE
Staphylococcus aureus: NEGATIVE

## 2018-03-15 LAB — BASIC METABOLIC PANEL
Anion gap: 7 (ref 5–15)
BUN: 14 mg/dL (ref 8–23)
CO2: 25 mmol/L (ref 22–32)
CREATININE: 1.02 mg/dL (ref 0.61–1.24)
Calcium: 9.5 mg/dL (ref 8.9–10.3)
Chloride: 104 mmol/L (ref 98–111)
GFR calc Af Amer: >60 mL/min (ref >60)
GLUCOSE: 120 mg/dL — AB (ref 70–99)
POTASSIUM: 4.6 mmol/L (ref 3.5–5.1)
Sodium: 136 mmol/L (ref 135–145)

## 2018-03-15 LAB — PROTIME-INR
INR: 1.1
PROTHROMBIN TIME: 14.1 s (ref 11.4–15.2)

## 2018-03-15 LAB — URINALYSIS, ROUTINE W REFLEX MICROSCOPIC
Bilirubin Urine: NEGATIVE
GLUCOSE, UA: NEGATIVE mg/dL
HGB URINE DIPSTICK: NEGATIVE
KETONES UR: NEGATIVE mg/dL
Leukocytes, UA: NEGATIVE
Nitrite: NEGATIVE
PH: 6 (ref 5.0–8.0)
Protein, ur: NEGATIVE mg/dL
Specific Gravity, Urine: 1.006 (ref 1.005–1.030)

## 2018-03-15 LAB — TYPE AND SCREEN
ABO/RH(D): O POS
Antibody Screen: NEGATIVE

## 2018-03-15 LAB — ABO/RH: ABO/RH(D): O POS

## 2018-03-15 NOTE — Progress Notes (Signed)
PCP - Wenda Low, MD Cardiologist - denies  Chest x-ray -03/15/18  EKG - 03/15/18 Stress Test - denies ECHO - denies Cardiac Cath - 2002  CPAP - uses daily  Aspirin Instructions: Contacted Horatio Pel, pt is to continue using 81mg  of ASA up until DOS.   Anesthesia review: Yes  Patient denies shortness of breath, fever, cough and chest pain at PAT appointment   Patient verbalized understanding of instructions that were given to them at the PAT appointment. Patient was also instructed that they will need to review over the PAT instructions again at home before surgery.

## 2018-03-18 ENCOUNTER — Ambulatory Visit: Payer: Medicare Other | Admitting: Cardiology

## 2018-03-18 ENCOUNTER — Encounter: Payer: Self-pay | Admitting: Cardiology

## 2018-03-18 VITALS — BP 126/72 | HR 62 | Ht 69.0 in | Wt 242.0 lb

## 2018-03-18 DIAGNOSIS — Z01818 Encounter for other preprocedural examination: Secondary | ICD-10-CM | POA: Diagnosis not present

## 2018-03-18 DIAGNOSIS — I4510 Unspecified right bundle-branch block: Secondary | ICD-10-CM

## 2018-03-18 NOTE — Patient Instructions (Signed)
Medication Instructions: Your physician recommends that you continue on your current medications as directed.    If you need a refill on your cardiac medications before your next appointment, please call your pharmacy.   Labwork: None  Procedures/Testing: None  Follow-Up: Your physician wants you to follow-up as needed with Dr. Harrell Gave. Call our office at [phone] to schedule appointment.   Special Instructions:    Thank you for choosing Heartcare at [facility]!!

## 2018-03-18 NOTE — Progress Notes (Signed)
Cardiology Office Note:    Date:  03/18/2018   ID:  [name], DOB 1941/02/27, MRN [id number]  PCP:  Wenda Low, MD  Cardiologist:  Buford Dresser, MD PhD  Referring MD: Wenda Low, MD   Chief Complaint  Patient presents with  . Follow-up  . Edema    Ankles.    History of Present Illness:    [name] is a 77 y.o. male with a hx of back surgery, thyroid cancer s/p thyroidectomy, remote prostate cancer (2001) and knee arthritis who presents as a new consult at the request of Dr. Lysle Rubens and Dr. Mayer Camel ([facility], fax number [phone]) for preoperative evaluation.  The patient and his wife state that they believe they were referred for an abnormal ECG. Noted to have right bundle branch block last year prior to surgery, appears unchanged today. He denies any concerns re: symptoms. His heart history is notable for a cath in 2002; he was told there was no significant disease. No stent needed, has had no issues since.  No chest pain. Breathing is fine. Mows the lawn, climbs stairs every day, no limitations. No PND, orthopnea, mild trace ankle edema chronically, weight stable. No syncope, no palpitations. Has total left knee surgery pending next week. Did well last year from a cardiac perspective after surgery; he was post thyroidectomy and developed hematoma that required evacuation, but otherwise no issues.   Past Medical History:  Diagnosis Date  . Arthritis   . High cholesterol   . Hypertension   . 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 PONV (postoperative nausea and vomiting)   . Prostate CA ([place])   . Sleep apnea    uses CPAP  . Thyroid cancer ([place]) 02/10/2017    Past Surgical History:  Procedure Laterality Date  . BACK SURGERY    . CARDIAC CATHETERIZATION  2002  . HEMATOMA EVACUATION N/A 02/13/2017   Procedure: EVACUATION HEMATOMA, NECK;  Surgeon: Melida Quitter, MD;  Location: [facility];  Service: ENT;  Laterality: N/A;  . HERNIA  REPAIR    . PROSTATE SURGERY  2001  . RADICAL NECK DISSECTION Left 02/12/2017   Procedure: RADICAL NECK DISSECTION;  Surgeon: Izora Gala, MD;  Location: [facility];  Service: ENT;  Laterality: Left;  Left modified neck dissection levels 1 through 4  . THYROIDECTOMY  02/12/2017  . THYROIDECTOMY N/A 02/12/2017   Procedure: THYROIDECTOMY;  Surgeon: Izora Gala, MD;  Location: [facility];  Service: ENT;  Laterality: N/A;  total  . TONSILLECTOMY      Current Medications: Current Outpatient Medications on File Prior to Visit  Medication Sig  . acetaminophen (TYLENOL) 650 MG CR tablet Take 1,300 mg by mouth 2 (two) times daily.  . Ascorbic Acid (VITAMIN C PO) Take 1 tablet by mouth daily.  Marland Kitchen aspirin EC 81 MG tablet Take 81 mg by mouth daily.  . Cholecalciferol (VITAMIN D-3 PO) Take 1 capsule by mouth daily.  . Coenzyme Q10 (COQ10) 100 MG CAPS Take 1 capsule by mouth daily.  Marland Kitchen levothyroxine (SYNTHROID, LEVOTHROID) 175 MCG tablet Take 175 mcg by mouth daily before breakfast.  . lisinopril (PRINIVIL,ZESTRIL) 20 MG tablet Take 20 mg by mouth daily.  . Multiple Vitamins-Minerals (ONE-A-DAY MENS 50+ ADVANTAGE) TABS Take 1 tablet by mouth daily.  . Omega-3 Fatty Acids (FISH OIL ULTRA PO) Take 1 capsule by mouth daily.  . simvastatin (ZOCOR) 40 MG tablet Take 40 mg by mouth daily at 6 PM.  . timolol (BETIMOL) 0.5 %  ophthalmic solution Place 1 drop into both eyes daily.   . Turmeric 500 MG CAPS Take 1 capsule by mouth 2 (two) times daily.  . vitamin B-12 (CYANOCOBALAMIN) 1000 MCG tablet Take 1,000 mcg by mouth daily.   No current facility-administered medications on file prior to visit.      Allergies:   Codeine   Social History   Socioeconomic History  . Marital status: Married    Spouse name: Not on file  . Number of children: Not on file  . Years of education: Not on file  . Highest education level: Not on file  Occupational History  . Not on file  Social Needs  . Financial resource strain: Not  on file  . Food insecurity:    Worry: Not on file    Inability: Not on file  . Transportation needs:    Medical: Not on file    Non-medical: Not on file  Tobacco Use  . Smoking status: Former Smoker    Types: Cigarettes    Last attempt to quit: 1973    Years since quitting: 46.5  . Smokeless tobacco: Never Used  Substance and Sexual Activity  . Alcohol use: Yes    Comment: once in a while  . Drug use: No  . Sexual activity: Not on file  Lifestyle  . Physical activity:    Days per week: Not on file    Minutes per session: Not on file  . Stress: Not on file  Relationships  . Social connections:    Talks on phone: Not on file    Gets together: Not on file    Attends religious service: Not on file    Active member of club or organization: Not on file    Attends meetings of clubs or organizations: Not on file    Relationship status: Not on file  Other Topics Concern  . Not on file  Social History Narrative  . Not on file     Family History: The patient's family history includes Lymphoma in his mother; Pancreatic cancer in his father. No history of heart disease that he knows of. Mat Gma died of a heart attack from "grief."  ROS:   Please see the history of present illness.  Additional pertinent ROS: Review of Systems  Constitutional: Negative for chills, diaphoresis, fever, malaise/fatigue and weight loss.  HENT: Positive for hearing loss. Negative for ear pain.   Eyes: Positive for pain. Negative for blurred vision.       Saw optometrist, thought to be irritation  Respiratory: Negative for cough, hemoptysis, shortness of breath and wheezing.   Cardiovascular: Positive for leg swelling. Negative for chest pain, palpitations, orthopnea, claudication and PND.       Chronic trace ankle edema.  Gastrointestinal: Negative for abdominal pain, blood in stool, melena, nausea and vomiting.  Genitourinary: Negative for dysuria and hematuria.  Musculoskeletal: Positive for joint  pain. Negative for falls and myalgias.  Skin: Negative for itching and rash.  Neurological: Negative for sensory change, focal weakness, loss of consciousness and weakness.  Endo/Heme/Allergies: Does not bruise/bleed easily.   EKGs/Labs/Other Studies Reviewed:    The following studies were reviewed today: Prior ECG 2018 normal sinus rhythm with RBBB  EKG:  EKG is ordered today.  The ekg ordered today demonstrates normal sinus rhythm with right bundle branch block, PACs in groupings of atrial bigeminy.  Recent Labs: 03/15/2018: BUN 14; Creatinine, Ser 1.02; Hemoglobin 14.6; Platelets 232; Potassium 4.6; Sodium 136  Recent Lipid  Panel No results found for: CHOL, TRIG, HDL, CHOLHDL, VLDL, LDLCALC, LDLDIRECT  Physical Exam:    VS:  BP 126/72 (BP Location: Left Arm, Patient Position: Sitting, Cuff Size: Large)   Pulse 62   Ht 5\' 9"  (1.753 m)   Wt 242 lb (109.8 kg)   BMI 35.74 kg/m     Wt Readings from Last 3 Encounters:  03/18/18 242 lb (109.8 kg)  03/15/18 243 lb 11.2 oz (110.5 kg)  02/13/17 245 lb (111.1 kg)     GEN: Well nourished, well developed in no acute distress HEENT: Normal NECK: No JVD; No carotid bruits LYMPHATICS: No lymphadenopathy CARDIAC: regular rhythm, normal S1 and S2, no murmurs, rubs, gallops. Radial and DP pulses 2+ bilaterally. RESPIRATORY:  Clear to auscultation without rales, wheezing or rhonchi  ABDOMEN: Soft, non-tender, non-distended MUSCULOSKELETAL:  Trace bilateral ankle edema; No deformity  SKIN: Warm and dry NEUROLOGIC:  Alert and oriented x 3 PSYCHIATRIC:  Normal affect   ASSESSMENT:    1. Pre-op evaluation   2. Right bundle branch block (RBBB) on electrocardiogram (ECG)    PLAN:    1. Preoperative evaluation: No history of heart disease, no active cardiac symptoms, able to do >4 METs of activity. RCRI score=0. The Revised Cardiac Risk Index indicates that he is at low risk for perioperative complications.   According to ACC/AHA  guidelines, no further cardiovascular testing needed.  The patient may proceed to surgery at acceptable risk.     Right bundle branch block is stable and unchanged from prior. PACs/atrial bigeminy require no additional management perioperatively.  Plan for follow up: as needed  Medication Adjustments/Labs and Tests Ordered: Current medicines are reviewed at length with the patient today.  Concerns regarding medicines are outlined above.  Orders Placed This Encounter  Procedures  . EKG 12-Lead   No orders of the defined types were placed in this encounter.   Patient Instructions  Medication Instructions: Your physician recommends that you continue on your current medications as directed.    If you need a refill on your cardiac medications before your next appointment, please call your pharmacy.   Labwork: None  Procedures/Testing: None  Follow-Up: Your physician wants you to follow-up as needed with Dr. Harrell Gave. Call our office at [phone] to schedule appointment.   Special Instructions:    Thank you for choosing Heartcare at [facility]!!       Signed, Buford Dresser, MD PhD 03/18/2018 4:00 PM    [place] [facility]

## 2018-03-22 ENCOUNTER — Ambulatory Visit
Admission: RE | Admit: 2018-03-22 | Discharge: 2018-03-22 | Disposition: A | Discharge status: Home / Self Care | Payer: Medicare Other | Source: Ambulatory Visit | Attending: Internal Medicine | Admitting: Internal Medicine

## 2018-03-22 DIAGNOSIS — M1712 Unilateral primary osteoarthritis, left knee: Secondary | ICD-10-CM | POA: Diagnosis present

## 2018-03-22 DIAGNOSIS — C770 Secondary and unspecified malignant neoplasm of lymph nodes of head, face and neck: Secondary | ICD-10-CM

## 2018-03-22 DIAGNOSIS — C73 Malignant neoplasm of thyroid gland: Secondary | ICD-10-CM

## 2018-03-22 MED ORDER — TRANEXAMIC ACID 1000 MG/10ML IV SOLN
1000.0000 mg | INTRAVENOUS | Status: AC
  Administered 2018-03-23: 1000 mg via INTRAVENOUS
  Filled 2018-03-22: qty 1100

## 2018-03-22 MED ORDER — TRANEXAMIC ACID 1000 MG/10ML IV SOLN
2000.0000 mg | INTRAVENOUS | Status: AC
  Administered 2018-03-23: 2000 mg via TOPICAL
  Filled 2018-03-22: qty 20

## 2018-03-22 MED ORDER — BUPIVACAINE LIPOSOME 1.3 % IJ SUSP
20.0000 mL | INTRAMUSCULAR | Status: AC
  Administered 2018-03-23: 20 mL
  Filled 2018-03-22: qty 20

## 2018-03-22 NOTE — H&P (Signed)
TOTAL KNEE ADMISSION H&P  Patient is being admitted for left total knee arthroplasty.  Subjective:  Chief Complaint:left knee pain.  HPI: [name], 77 y.o. male, has a history of pain and functional disability in the left knee due to arthritis and has failed non-surgical conservative treatments for greater than 12 weeks to includeNSAID's and/or analgesics, corticosteriod injections, viscosupplementation injections, flexibility and strengthening excercises, weight reduction as appropriate and activity modification.  Onset of symptoms was gradual, starting 8 years ago with gradually worsening course since that time. The patient noted no past surgery on the left knee(s).  Patient currently rates pain in the left knee(s) at 10 out of 10 with activity. Patient has night pain, worsening of pain with activity and weight bearing, pain that interferes with activities of daily living, pain with passive range of motion, crepitus and joint swelling.  Patient has evidence of periarticular osteophytes, joint subluxation and joint space narrowing by imaging studies.   There is no active infection.  Patient Active Problem List   Diagnosis Date Noted  . Right bundle branch block (RBBB) on electrocardiogram (ECG) 03/18/2018  . Hematoma of neck 02/14/2017  . Thyroid cancer ([place]) 02/12/2017   Past Medical History:  Diagnosis Date  . Arthritis   . High cholesterol   . Hypertension   . 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 PONV (postoperative nausea and vomiting)   . Prostate CA ([place])   . Sleep apnea    uses CPAP  . Thyroid cancer ([place]) 02/10/2017    Past Surgical History:  Procedure Laterality Date  . BACK SURGERY    . CARDIAC CATHETERIZATION  2002  . HEMATOMA EVACUATION N/A 02/13/2017   Procedure: EVACUATION HEMATOMA, NECK;  Surgeon: Melida Quitter, MD;  Location: [facility];  Service: ENT;  Laterality: N/A;  . HERNIA REPAIR    . PROSTATE SURGERY  2001  . RADICAL NECK DISSECTION Left 02/12/2017    Procedure: RADICAL NECK DISSECTION;  Surgeon: Izora Gala, MD;  Location: [facility];  Service: ENT;  Laterality: Left;  Left modified neck dissection levels 1 through 4  . THYROIDECTOMY  02/12/2017  . THYROIDECTOMY N/A 02/12/2017   Procedure: THYROIDECTOMY;  Surgeon: Izora Gala, MD;  Location: [facility];  Service: ENT;  Laterality: N/A;  total  . TONSILLECTOMY      No current facility-administered medications for this encounter.    Current Outpatient Medications  Medication Sig Dispense Refill Last Dose  . acetaminophen (TYLENOL) 650 MG CR tablet Take 1,300 mg by mouth 2 (two) times daily.   Taking  . Ascorbic Acid (VITAMIN C PO) Take 1 tablet by mouth daily.   Taking  . aspirin EC 81 MG tablet Take 81 mg by mouth daily.   Taking  . Cholecalciferol (VITAMIN D-3 PO) Take 1 capsule by mouth daily.   Taking  . Coenzyme Q10 (COQ10) 100 MG CAPS Take 1 capsule by mouth daily.   Taking  . levothyroxine (SYNTHROID, LEVOTHROID) 175 MCG tablet Take 175 mcg by mouth daily before breakfast.   Taking  . lisinopril (PRINIVIL,ZESTRIL) 20 MG tablet Take 20 mg by mouth daily.   Taking  . Multiple Vitamins-Minerals (ONE-A-DAY MENS 50+ ADVANTAGE) TABS Take 1 tablet by mouth daily.   Taking  . Omega-3 Fatty Acids (FISH OIL ULTRA PO) Take 1 capsule by mouth daily.   Taking  . simvastatin (ZOCOR) 40 MG tablet Take 40 mg by mouth daily at 6 PM.   Taking  . timolol (BETIMOL) 0.5 % ophthalmic  solution Place 1 drop into both eyes daily.    Taking  . Turmeric 500 MG CAPS Take 1 capsule by mouth 2 (two) times daily.   Taking  . vitamin B-12 (CYANOCOBALAMIN) 1000 MCG tablet Take 1,000 mcg by mouth daily.   Taking   Allergies  Allergen Reactions  . Codeine Other (See Comments)    Makes the patient "feel weird"    Social History   Tobacco Use  . Smoking status: Former Smoker    Types: Cigarettes    Last attempt to quit: 1973    Years since quitting: 46.6  . Smokeless tobacco: Never Used  Substance Use Topics  .  Alcohol use: Yes    Comment: once in a while    Family History  Problem Relation Age of Onset  . Lymphoma Mother   . Pancreatic cancer Father      Review of Systems  Constitutional: Negative.   HENT: Negative.   Eyes: Negative.   Respiratory: Negative.   Cardiovascular: Negative.   Gastrointestinal: Negative.   Genitourinary: Negative.        Prostate cancer  Musculoskeletal: Positive for joint pain and myalgias.  Skin: Negative.   Neurological: Negative.   Endo/Heme/Allergies: Negative.   Psychiatric/Behavioral: Negative.     Objective:  Physical Exam  Constitutional: He is oriented to person, place, and time. He appears well-developed and well-nourished.  HENT:  Head: Normocephalic and atraumatic.  Eyes: Pupils are equal, round, and reactive to light.  Neck: Normal range of motion. Neck supple.  Cardiovascular: Intact distal pulses.  Respiratory: Effort normal.  Musculoskeletal: He exhibits tenderness and deformity.  Impressive valgus deformity to the left knee of 15 5 varus deformity to the right knee.  Both knees do come to full extension, both knees flex about 120.  Very tender along the lateral joint line of the left knee, moderately tender along the medial joint line of the right knee.  Toes are pink and well-perfused.  Skin is intact.  Neurovascular intact distally.  Neurological: He is alert and oriented to person, place, and time.  Skin: Skin is warm and dry.  Psychiatric: He has a normal mood and affect. His behavior is normal. Judgment and thought content normal.    Vital signs in last 24 hours:    Labs:   Estimated body mass index is 35.74 kg/m as calculated from the following:   Height as of 03/18/18: 5\' 9"  (1.753 m).   Weight as of 03/18/18: 109.8 kg (242 lb).   Imaging Review Plain radiographs demonstrate AP, Rosenberg, lateral and sunrise x-rays show severe end-stage arthritis lateral compartment of the left knee bone-on-bone with lateral  subluxation of tibia beneath the femur.  The right knee has moderate osteoarthritis primarily the medial compartment, with peripheral osteophytes, loss of one half of the articular cartilage.     Preoperative templating of the joint replacement has been completed, documented, and submitted to the Operating Room personnel in order to optimize intra-operative equipment management.   Anticipated LOS equal to or greater than 2 midnights due to - Age 41 and older with one or more of the following:  - Obesity  - Expected need for hospital services (PT, OT, Nursing) required for safe  discharge  - Anticipated need for postoperative skilled nursing care or inpatient rehab  - Active co-morbidities: urinary issues with radiacl prostatectomy   Assessment/Plan:  End stage arthritis, left knee   The patient history, physical examination, clinical judgment of the provider and imaging studies  are consistent with end stage degenerative joint disease of the left knee(s) and total knee arthroplasty is deemed medically necessary. The treatment options including medical management, injection therapy arthroscopy and arthroplasty were discussed at length. The risks and benefits of total knee arthroplasty were presented and reviewed. The risks due to aseptic loosening, infection, stiffness, patella tracking problems, thromboembolic complications and other imponderables were discussed. The patient acknowledged the explanation, agreed to proceed with the plan and consent was signed. Patient is being admitted for inpatient treatment for surgery, pain control, PT, OT, prophylactic antibiotics, VTE prophylaxis, progressive ambulation and ADL's and discharge planning. The patient is planning to be discharged home with home health services.

## 2018-03-23 ENCOUNTER — Encounter (HOSPITAL_COMMUNITY): Payer: Self-pay

## 2018-03-23 ENCOUNTER — Ambulatory Visit (HOSPITAL_COMMUNITY): Payer: Medicare Other | Admitting: Physician Assistant

## 2018-03-23 ENCOUNTER — Observation Stay (HOSPITAL_COMMUNITY)
Admission: RE | Admit: 2018-03-23 | Discharge: 2018-03-25 | Disposition: A | Discharge status: Home / Self Care | Payer: Medicare Other | Source: Ambulatory Visit | Attending: Orthopedic Surgery | Admitting: Orthopedic Surgery

## 2018-03-23 ENCOUNTER — Ambulatory Visit (HOSPITAL_COMMUNITY): Payer: Medicare Other | Admitting: Certified Registered Nurse Anesthetist

## 2018-03-23 ENCOUNTER — Other Ambulatory Visit: Payer: Self-pay

## 2018-03-23 ENCOUNTER — Encounter (HOSPITAL_COMMUNITY)
Admission: RE | Disposition: A | Discharge status: Home / Self Care | Payer: Self-pay | Source: Ambulatory Visit | Attending: Orthopedic Surgery

## 2018-03-23 DIAGNOSIS — Z85850 Personal history of malignant neoplasm of thyroid: Secondary | ICD-10-CM | POA: Diagnosis not present

## 2018-03-23 DIAGNOSIS — Z885 Allergy status to narcotic agent status: Secondary | ICD-10-CM | POA: Insufficient documentation

## 2018-03-23 DIAGNOSIS — N281 Cyst of kidney, acquired: Secondary | ICD-10-CM | POA: Diagnosis not present

## 2018-03-23 DIAGNOSIS — Z87891 Personal history of nicotine dependence: Secondary | ICD-10-CM | POA: Diagnosis not present

## 2018-03-23 DIAGNOSIS — Z6835 Body mass index (BMI) 35.0-35.9, adult: Secondary | ICD-10-CM | POA: Insufficient documentation

## 2018-03-23 DIAGNOSIS — Z79899 Other long term (current) drug therapy: Secondary | ICD-10-CM | POA: Insufficient documentation

## 2018-03-23 DIAGNOSIS — E039 Hypothyroidism, unspecified: Secondary | ICD-10-CM | POA: Diagnosis not present

## 2018-03-23 DIAGNOSIS — Z7982 Long term (current) use of aspirin: Secondary | ICD-10-CM | POA: Diagnosis not present

## 2018-03-23 DIAGNOSIS — Z9989 Dependence on other enabling machines and devices: Secondary | ICD-10-CM | POA: Diagnosis not present

## 2018-03-23 DIAGNOSIS — M1712 Unilateral primary osteoarthritis, left knee: Secondary | ICD-10-CM | POA: Diagnosis present

## 2018-03-23 DIAGNOSIS — E7800 Pure hypercholesterolemia, unspecified: Secondary | ICD-10-CM | POA: Diagnosis not present

## 2018-03-23 DIAGNOSIS — I10 Essential (primary) hypertension: Secondary | ICD-10-CM | POA: Insufficient documentation

## 2018-03-23 DIAGNOSIS — G4733 Obstructive sleep apnea (adult) (pediatric): Secondary | ICD-10-CM | POA: Diagnosis not present

## 2018-03-23 DIAGNOSIS — Z8546 Personal history of malignant neoplasm of prostate: Secondary | ICD-10-CM | POA: Insufficient documentation

## 2018-03-23 DIAGNOSIS — I4510 Unspecified right bundle-branch block: Secondary | ICD-10-CM | POA: Insufficient documentation

## 2018-03-23 HISTORY — DX: Dependence on other enabling machines and devices: Z99.89

## 2018-03-23 HISTORY — DX: Other chronic pain: G89.29

## 2018-03-23 HISTORY — DX: Deforming dorsopathy, unspecified: M43.9

## 2018-03-23 HISTORY — DX: Low back pain: M54.5

## 2018-03-23 HISTORY — PX: TOTAL KNEE ARTHROPLASTY: SHX125

## 2018-03-23 HISTORY — DX: Hypothyroidism, unspecified: E03.9

## 2018-03-23 HISTORY — DX: Low back pain, unspecified: M54.50

## 2018-03-23 HISTORY — DX: Obstructive sleep apnea (adult) (pediatric): G47.33

## 2018-03-23 SURGERY — ARTHROPLASTY, KNEE, TOTAL
Anesthesia: Monitor Anesthesia Care | Site: Knee | Laterality: Left

## 2018-03-23 MED ORDER — OXYCODONE HCL 5 MG/5ML PO SOLN: 5.0000 mg | Freq: Once | ORAL | Status: DC | PRN

## 2018-03-23 MED ORDER — MIDAZOLAM HCL 2 MG/2ML IJ SOLN
INTRAMUSCULAR | Status: AC
  Administered 2018-03-23: 1 mg via INTRAVENOUS
  Filled 2018-03-23: qty 2

## 2018-03-23 MED ORDER — CHLORHEXIDINE GLUCONATE 4 % EX LIQD: 60.0000 mL | Freq: Once | CUTANEOUS | Status: DC

## 2018-03-23 MED ORDER — OXYCODONE-ACETAMINOPHEN 5-325 MG PO TABS: 1.0000 | ORAL_TABLET | ORAL | 0 refills | Status: DC | PRN

## 2018-03-23 MED ORDER — ASPIRIN 81 MG PO CHEW
81.0000 mg | CHEWABLE_TABLET | Freq: Two times a day (BID) | ORAL | Status: DC
  Administered 2018-03-23 – 2018-03-25 (×4): 81 mg via ORAL
  Filled 2018-03-23 (×4): qty 1

## 2018-03-23 MED ORDER — TRANEXAMIC ACID 1000 MG/10ML IV SOLN: 1000.0000 mg | INTRAVENOUS | Status: DC

## 2018-03-23 MED ORDER — GABAPENTIN 300 MG PO CAPS
300.0000 mg | ORAL_CAPSULE | Freq: Three times a day (TID) | ORAL | Status: DC
  Administered 2018-03-23 – 2018-03-25 (×5): 300 mg via ORAL
  Filled 2018-03-23 (×4): qty 1
  Filled 2018-03-23: qty 3

## 2018-03-23 MED ORDER — PHENOL 1.4 % MT LIQD: 1.0000 | OROMUCOSAL | Status: DC | PRN

## 2018-03-23 MED ORDER — ALUM & MAG HYDROXIDE-SIMETH 200-200-20 MG/5ML PO SUSP: 30.0000 mL | ORAL | Status: DC | PRN

## 2018-03-23 MED ORDER — BUPIVACAINE-EPINEPHRINE (PF) 0.25% -1:200000 IJ SOLN
INTRAMUSCULAR | Status: DC | PRN
  Administered 2018-03-23: 30 mL

## 2018-03-23 MED ORDER — FENTANYL CITRATE (PF) 250 MCG/5ML IJ SOLN
INTRAMUSCULAR | Status: AC
  Filled 2018-03-23: qty 5

## 2018-03-23 MED ORDER — ACETAMINOPHEN 325 MG PO TABS: 325.0000 mg | ORAL_TABLET | Freq: Four times a day (QID) | ORAL | Status: DC | PRN

## 2018-03-23 MED ORDER — MENTHOL 3 MG MT LOZG
1.0000 | LOZENGE | OROMUCOSAL | Status: DC | PRN
  Administered 2018-03-24: 3 mg via ORAL
  Filled 2018-03-23: qty 9

## 2018-03-23 MED ORDER — METOCLOPRAMIDE HCL 5 MG PO TABS: 5.0000 mg | ORAL_TABLET | Freq: Three times a day (TID) | ORAL | Status: DC | PRN

## 2018-03-23 MED ORDER — LISINOPRIL 20 MG PO TABS
20.0000 mg | ORAL_TABLET | Freq: Every day | ORAL | Status: DC
  Administered 2018-03-24 – 2018-03-25 (×2): 20 mg via ORAL
  Filled 2018-03-23 (×2): qty 1

## 2018-03-23 MED ORDER — FLEET ENEMA 7-19 GM/118ML RE ENEM: 1.0000 | ENEMA | Freq: Once | RECTAL | Status: DC | PRN

## 2018-03-23 MED ORDER — CEFAZOLIN SODIUM-DEXTROSE 2-4 GM/100ML-% IV SOLN
2.0000 g | INTRAVENOUS | Status: AC
  Administered 2018-03-23: 2 g via INTRAVENOUS
  Filled 2018-03-23: qty 100

## 2018-03-23 MED ORDER — SODIUM CHLORIDE 0.9 % IJ SOLN
INTRAMUSCULAR | Status: DC | PRN
  Administered 2018-03-23: 50 mL

## 2018-03-23 MED ORDER — DEXAMETHASONE SODIUM PHOSPHATE 10 MG/ML IJ SOLN
10.0000 mg | Freq: Once | INTRAMUSCULAR | Status: AC
  Administered 2018-03-24: 10 mg via INTRAVENOUS
  Filled 2018-03-23: qty 1

## 2018-03-23 MED ORDER — MIDAZOLAM HCL 2 MG/2ML IJ SOLN
1.0000 mg | Freq: Once | INTRAMUSCULAR | Status: AC
Start: 2018-03-23 — End: 2018-03-23
  Administered 2018-03-23: 1 mg via INTRAVENOUS

## 2018-03-23 MED ORDER — OXYCODONE HCL 5 MG PO TABS
5.0000 mg | ORAL_TABLET | Freq: Once | ORAL | Status: DC | PRN
Start: 2018-03-23 — End: 2018-03-23

## 2018-03-23 MED ORDER — BUPIVACAINE LIPOSOME 1.3 % IJ SUSP: 20.0000 mL | Freq: Once | INTRAMUSCULAR | Status: DC

## 2018-03-23 MED ORDER — ASPIRIN EC 81 MG PO TBEC: 81.0000 mg | DELAYED_RELEASE_TABLET | Freq: Two times a day (BID) | ORAL | 0 refills | Status: DC

## 2018-03-23 MED ORDER — HYDROMORPHONE HCL 1 MG/ML IJ SOLN
0.5000 mg | INTRAMUSCULAR | Status: DC | PRN
  Administered 2018-03-24: 1 mg via INTRAVENOUS
  Filled 2018-03-23: qty 1

## 2018-03-23 MED ORDER — LEVOTHYROXINE SODIUM 75 MCG PO TABS
175.0000 ug | ORAL_TABLET | Freq: Every day | ORAL | Status: DC
  Administered 2018-03-24 – 2018-03-25 (×2): 175 ug via ORAL
  Filled 2018-03-23 (×2): qty 1

## 2018-03-23 MED ORDER — BUPIVACAINE-EPINEPHRINE (PF) 0.25% -1:200000 IJ SOLN
INTRAMUSCULAR | Status: AC
  Filled 2018-03-23: qty 30

## 2018-03-23 MED ORDER — METHOCARBAMOL 500 MG PO TABS
500.0000 mg | ORAL_TABLET | Freq: Four times a day (QID) | ORAL | Status: DC | PRN
  Administered 2018-03-24 (×2): 500 mg via ORAL
  Filled 2018-03-23 (×2): qty 1

## 2018-03-23 MED ORDER — OXYCODONE HCL 5 MG PO TABS
5.0000 mg | ORAL_TABLET | ORAL | Status: DC | PRN
  Administered 2018-03-23 – 2018-03-24 (×3): 10 mg via ORAL
  Filled 2018-03-23 (×3): qty 2

## 2018-03-23 MED ORDER — LACTATED RINGERS IV SOLN
INTRAVENOUS | Status: DC
  Administered 2018-03-23 (×2): via INTRAVENOUS

## 2018-03-23 MED ORDER — TIZANIDINE HCL 2 MG PO TABS: 2.0000 mg | ORAL_TABLET | Freq: Four times a day (QID) | ORAL | 0 refills | Status: DC | PRN

## 2018-03-23 MED ORDER — LACTATED RINGERS IV SOLN: INTRAVENOUS | Status: DC

## 2018-03-23 MED ORDER — TRANEXAMIC ACID 1000 MG/10ML IV SOLN
1000.0000 mg | Freq: Once | INTRAVENOUS | Status: AC
  Administered 2018-03-23: 1000 mg via INTRAVENOUS
  Filled 2018-03-23: qty 10

## 2018-03-23 MED ORDER — BISACODYL 5 MG PO TBEC: 5.0000 mg | DELAYED_RELEASE_TABLET | Freq: Every day | ORAL | Status: DC | PRN

## 2018-03-23 MED ORDER — ONDANSETRON HCL 4 MG PO TABS: 4.0000 mg | ORAL_TABLET | Freq: Four times a day (QID) | ORAL | Status: DC | PRN

## 2018-03-23 MED ORDER — PANTOPRAZOLE SODIUM 40 MG PO TBEC
40.0000 mg | DELAYED_RELEASE_TABLET | Freq: Every day | ORAL | Status: DC
  Administered 2018-03-24 – 2018-03-25 (×2): 40 mg via ORAL
  Filled 2018-03-23 (×2): qty 1

## 2018-03-23 MED ORDER — FENTANYL CITRATE (PF) 100 MCG/2ML IJ SOLN
INTRAMUSCULAR | Status: AC
  Administered 2018-03-23: 50 ug via INTRAVENOUS
  Filled 2018-03-23: qty 2

## 2018-03-23 MED ORDER — METOCLOPRAMIDE HCL 5 MG/ML IJ SOLN: 5.0000 mg | Freq: Three times a day (TID) | INTRAMUSCULAR | Status: DC | PRN

## 2018-03-23 MED ORDER — BUPIVACAINE IN DEXTROSE 0.75-8.25 % IT SOLN
INTRATHECAL | Status: DC | PRN
  Administered 2018-03-23: 2 mL via INTRATHECAL

## 2018-03-23 MED ORDER — PROPOFOL 10 MG/ML IV BOLUS
INTRAVENOUS | Status: AC
  Filled 2018-03-23: qty 20

## 2018-03-23 MED ORDER — FENTANYL CITRATE (PF) 100 MCG/2ML IJ SOLN: 25.0000 ug | INTRAMUSCULAR | Status: DC | PRN

## 2018-03-23 MED ORDER — METHOCARBAMOL 1000 MG/10ML IJ SOLN
500.0000 mg | Freq: Four times a day (QID) | INTRAVENOUS | Status: DC | PRN
  Filled 2018-03-23: qty 5

## 2018-03-23 MED ORDER — SODIUM CHLORIDE 0.9 % IR SOLN
Status: DC | PRN
  Administered 2018-03-23: 3000 mL

## 2018-03-23 MED ORDER — KCL IN DEXTROSE-NACL 20-5-0.45 MEQ/L-%-% IV SOLN
INTRAVENOUS | Status: DC
  Administered 2018-03-23: 18:00:00 via INTRAVENOUS
  Filled 2018-03-23: qty 1000

## 2018-03-23 MED ORDER — DIPHENHYDRAMINE HCL 12.5 MG/5ML PO ELIX
12.5000 mg | ORAL_SOLUTION | ORAL | Status: DC | PRN
Start: 2018-03-23 — End: 2018-03-25

## 2018-03-23 MED ORDER — DOCUSATE SODIUM 100 MG PO CAPS
100.0000 mg | ORAL_CAPSULE | Freq: Two times a day (BID) | ORAL | Status: DC
  Administered 2018-03-23 – 2018-03-25 (×4): 100 mg via ORAL
  Filled 2018-03-23 (×4): qty 1

## 2018-03-23 MED ORDER — POLYETHYLENE GLYCOL 3350 17 G PO PACK: 17.0000 g | PACK | Freq: Every day | ORAL | Status: DC | PRN

## 2018-03-23 MED ORDER — FENTANYL CITRATE (PF) 250 MCG/5ML IJ SOLN
INTRAMUSCULAR | Status: DC | PRN
  Administered 2018-03-23 (×2): 25 ug via INTRAVENOUS

## 2018-03-23 MED ORDER — SIMVASTATIN 40 MG PO TABS
40.0000 mg | ORAL_TABLET | Freq: Every day | ORAL | Status: DC
  Administered 2018-03-23 – 2018-03-24 (×2): 40 mg via ORAL
  Filled 2018-03-23 (×2): qty 1

## 2018-03-23 MED ORDER — FENTANYL CITRATE (PF) 100 MCG/2ML IJ SOLN
50.0000 ug | Freq: Once | INTRAMUSCULAR | Status: AC
  Administered 2018-03-23: 50 ug via INTRAVENOUS

## 2018-03-23 MED ORDER — PROPOFOL 500 MG/50ML IV EMUL
INTRAVENOUS | Status: DC | PRN
  Administered 2018-03-23: 50 ug/kg/min via INTRAVENOUS

## 2018-03-23 MED ORDER — ROPIVACAINE HCL 7.5 MG/ML IJ SOLN
INTRAMUSCULAR | Status: DC | PRN
  Administered 2018-03-23: 20 mL via PERINEURAL

## 2018-03-23 MED ORDER — TIMOLOL MALEATE 0.5 % OP SOLN
1.0000 [drp] | Freq: Every day | OPHTHALMIC | Status: DC
  Administered 2018-03-24 – 2018-03-25 (×2): 1 [drp] via OPHTHALMIC
  Filled 2018-03-23: qty 5

## 2018-03-23 MED ORDER — ONDANSETRON HCL 4 MG/2ML IJ SOLN
4.0000 mg | Freq: Four times a day (QID) | INTRAMUSCULAR | Status: DC | PRN
  Administered 2018-03-24: 4 mg via INTRAVENOUS
  Filled 2018-03-23: qty 2

## 2018-03-23 SURGICAL SUPPLY — 50 items
BANDAGE ESMARK 6X9 LF (GAUZE/BANDAGES/DRESSINGS) ×1 IMPLANT
BLADE SAG 18X100X1.27 (BLADE) ×2 IMPLANT
BLADE SAGITTAL 13X1.27X60 (BLADE) IMPLANT
BLADE SAW SGTL 13X75X1.27 (BLADE) IMPLANT
BNDG CMPR 9X6 STRL LF SNTH (GAUZE/BANDAGES/DRESSINGS) ×1
BNDG CMPR MED 10X6 ELC LF (GAUZE/BANDAGES/DRESSINGS) ×1
BNDG ELASTIC 6X10 VLCR STRL LF (GAUZE/BANDAGES/DRESSINGS) ×2 IMPLANT
BNDG ESMARK 6X9 LF (GAUZE/BANDAGES/DRESSINGS) ×2
BOWL SMART MIX CTS (DISPOSABLE) ×2 IMPLANT
CAPT KNEE TOTAL 3 ATTUNE ×1 IMPLANT
CEMENT HV SMART SET (Cement) ×4 IMPLANT
COVER SURGICAL LIGHT HANDLE (MISCELLANEOUS) ×2 IMPLANT
CUFF TOURNIQUET SINGLE 34IN LL (TOURNIQUET CUFF) ×2 IMPLANT
CUFF TOURNIQUET SINGLE 44IN (TOURNIQUET CUFF) IMPLANT
DRAPE EXTREMITY T 121X128X90 (DRAPE) ×2 IMPLANT
DRAPE U-SHAPE 47X51 STRL (DRAPES) ×2 IMPLANT
DRSG AQUACEL AG ADV 3.5X10 (GAUZE/BANDAGES/DRESSINGS) ×2 IMPLANT
DURAPREP 26ML APPLICATOR (WOUND CARE) ×2 IMPLANT
ELECT REM PT RETURN 9FT ADLT (ELECTROSURGICAL) ×2
ELECTRODE REM PT RTRN 9FT ADLT (ELECTROSURGICAL) ×1 IMPLANT
GLOVE BIO SURGEON STRL SZ7.5 (GLOVE) ×2 IMPLANT
GLOVE BIO SURGEON STRL SZ8.5 (GLOVE) ×2 IMPLANT
GLOVE BIOGEL PI IND STRL 8 (GLOVE) ×1 IMPLANT
GLOVE BIOGEL PI IND STRL 9 (GLOVE) ×1 IMPLANT
GLOVE BIOGEL PI INDICATOR 8 (GLOVE) ×1
GLOVE BIOGEL PI INDICATOR 9 (GLOVE) ×1
GOWN STRL REUS W/ TWL LRG LVL3 (GOWN DISPOSABLE) ×1 IMPLANT
GOWN STRL REUS W/ TWL XL LVL3 (GOWN DISPOSABLE) ×2 IMPLANT
GOWN STRL REUS W/TWL LRG LVL3 (GOWN DISPOSABLE) ×2
GOWN STRL REUS W/TWL XL LVL3 (GOWN DISPOSABLE) ×4
HANDPIECE INTERPULSE COAX TIP (DISPOSABLE) ×2
HOOD PEEL AWAY FACE SHEILD DIS (HOOD) ×4 IMPLANT
KIT BASIN OR (CUSTOM PROCEDURE TRAY) ×2 IMPLANT
KIT TURNOVER KIT B (KITS) ×2 IMPLANT
MANIFOLD NEPTUNE II (INSTRUMENTS) ×2 IMPLANT
NEEDLE 22X1 1/2 (OR ONLY) (NEEDLE) ×4 IMPLANT
NS IRRIG 1000ML POUR BTL (IV SOLUTION) ×2 IMPLANT
PACK TOTAL JOINT (CUSTOM PROCEDURE TRAY) ×2 IMPLANT
PAD ARMBOARD 7.5X6 YLW CONV (MISCELLANEOUS) ×4 IMPLANT
SET HNDPC FAN SPRY TIP SCT (DISPOSABLE) ×1 IMPLANT
SUT VIC AB 1 CTX 36 (SUTURE) ×2
SUT VIC AB 1 CTX36XBRD ANBCTR (SUTURE) ×1 IMPLANT
SUT VIC AB 2-0 CT1 27 (SUTURE) ×2
SUT VIC AB 2-0 CT1 TAPERPNT 27 (SUTURE) ×1 IMPLANT
SUT VIC AB 3-0 CT1 27 (SUTURE) ×2
SUT VIC AB 3-0 CT1 TAPERPNT 27 (SUTURE) ×1 IMPLANT
SYR CONTROL 10ML LL (SYRINGE) ×4 IMPLANT
TOWEL OR 17X24 6PK STRL BLUE (TOWEL DISPOSABLE) ×2 IMPLANT
TOWEL OR 17X26 10 PK STRL BLUE (TOWEL DISPOSABLE) ×2 IMPLANT
TRAY CATH 16FR W/PLASTIC CATH (SET/KITS/TRAYS/PACK) IMPLANT

## 2018-03-23 NOTE — Anesthesia Procedure Notes (Signed)
Spinal  Patient location during procedure: OB Start time: 03/23/2018 1:06 PM End time: 03/23/2018 1:09 PM Staffing Anesthesiologist: Oleta Mouse, MD Preanesthetic Checklist Completed: patient identified, surgical consent, pre-op evaluation, timeout performed, IV checked, risks and benefits discussed and monitors and equipment checked Spinal Block Patient position: sitting Prep: ChloraPrep and site prepped and draped Patient monitoring: heart rate, cardiac monitor, continuous pulse ox and blood pressure Approach: midline Location: L3-4 Injection technique: single-shot Needle Needle type: Pencan  Needle gauge: 24 G Needle length: 10 cm Assessment Sensory level: T6

## 2018-03-23 NOTE — Anesthesia Preprocedure Evaluation (Signed)
Anesthesia Evaluation  Patient identified by MRN, date of birth, ID band Patient awake    Reviewed: Allergy & Precautions, NPO status , Patient's Chart, lab work & pertinent test results  History of Anesthesia Complications (+) PONV and history of anesthetic complications  Airway Mallampati: I  TM Distance: >3 FB Neck ROM: Full    Dental  (+) Teeth Intact, Chipped,    Pulmonary neg shortness of breath, sleep apnea and Continuous Positive Airway Pressure Ventilation , neg COPD, former smoker,    breath sounds clear to auscultation       Cardiovascular hypertension, Pt. on medications  Rhythm:Regular     Neuro/Psych negative neurological ROS     GI/Hepatic negative GI ROS, Neg liver ROS,   Endo/Other  Hypothyroidism Morbid obesity  Renal/GU Renal diseaseRenal cysts     Musculoskeletal  (+) Arthritis ,   Abdominal   Peds  Hematology negative hematology ROS (+)   Anesthesia Other Findings   Reproductive/Obstetrics                             Anesthesia Physical Anesthesia Plan  ASA: II  Anesthesia Plan: MAC, Regional and Spinal   Post-op Pain Management:  Regional for Post-op pain   Induction:   PONV Risk Score and Plan: 2 and Treatment may vary due to age or medical condition  Airway Management Planned: Nasal Cannula  Additional Equipment: None  Intra-op Plan:   Post-operative Plan:   Informed Consent: I have reviewed the patients History and Physical, chart, labs and discussed the procedure including the risks, benefits and alternatives for the proposed anesthesia with the patient or authorized representative who has indicated his/her understanding and acceptance.   Dental advisory given  Plan Discussed with: Surgeon  Anesthesia Plan Comments:         Anesthesia Quick Evaluation

## 2018-03-23 NOTE — Interval H&P Note (Signed)
History and Physical Interval Note:  03/23/2018 12:49 PM  [name]  has presented today for surgery, with the diagnosis of LEFT KNEE OSTEOARTHRITIS  The various methods of treatment have been discussed with the patient and family. After consideration of risks, benefits and other options for treatment, the patient has consented to  Procedure(s): TOTAL KNEE ARTHROPLASTY (Left) as a surgical intervention .  The patient's history has been reviewed, patient examined, no change in status, stable for surgery.  I have reviewed the patient's chart and labs.  Questions were answered to the patient's satisfaction.     Kerin Salen

## 2018-03-23 NOTE — Transfer of Care (Signed)
Immediate Anesthesia Transfer of Care Note  Patient: [name]  Procedure(s) Performed: TOTAL KNEE ARTHROPLASTY (Left Knee)  Patient Location: PACU  Anesthesia Type:MAC, Spinal and MAC combined with regional for post-op pain  Level of Consciousness: awake, alert  and oriented  Airway & Oxygen Therapy: Patient Spontanous Breathing  Post-op Assessment: Report given to RN and Post -op Vital signs reviewed and stable  Post vital signs: Reviewed and stable  Last Vitals:  Vitals Value Taken Time  BP 121/73 03/23/2018  3:15 PM  Temp    Pulse 65 03/23/2018  3:18 PM  Resp 19 03/23/2018  3:18 PM  SpO2 97 % 03/23/2018  3:18 PM  Vitals shown include unvalidated device data.  Last Pain:  Vitals:   03/23/18 1057  TempSrc:   PainSc: 0-No pain      Patients Stated Pain Goal: 3 (62/86/38 1771)  Complications: No apparent anesthesia complications

## 2018-03-23 NOTE — Progress Notes (Signed)
Orthopedic Tech Progress Note Patient DetailsBrendon Roman [facility] [birth year] [id number]  Ortho Devices Type of Ortho Device: Bone foam zero knee Ortho Device/Splint Location: lle Ortho Device/Splint Interventions: Application   Post Interventions Patient Tolerated: Well Instructions Provided: Care of device   [name] 03/23/2018, 6:10 PM

## 2018-03-23 NOTE — Op Note (Signed)
PATIENT ID:      [name]  MRN:     [id number] DOB/AGE:    Jul 26, 1941 / 77 y.o.       OPERATIVE REPORT    DATE OF PROCEDURE:  03/23/2018       PREOPERATIVE DIAGNOSIS:   LEFT KNEE OSTEOARTHRITIS      Estimated body mass index is 35.74 kg/m as calculated from the following:   Height as of this encounter: 5\' 9"  (1.753 m).   Weight as of this encounter: 242 lb (109.8 kg).                                                        POSTOPERATIVE DIAGNOSIS:   LEFT KNEE OSTEOARTHRITIS                                                                      PROCEDURE:  Procedure(s): TOTAL KNEE ARTHROPLASTY Using DepuyAttune RP implants #9L Femur, #10Tibia, 5 mm Attune RP bearing, 41 Patella     SURGEON: Kerin Salen    ASSISTANT:   Kerry Hough. [employer]   (Present and scrubbed throughout the case, critical for assistance with exposure, retraction, instrumentation, and closure.)         ANESTHESIA: Spinal, 20cc Exparel, 50cc 0.25% Marcaine  EBL: 300cc  FLUID REPLACEMENT: 1600cc crystalloid  TOURNIQUET TIME: 13min  Drains: None  Tranexamic Acid: 1gm IV, 2gm topical  COMPLICATIONS:  None         INDICATIONS FOR PROCEDURE: The patient has  LEFT KNEE OSTEOARTHRITIS, Val deformities, XR shows bone on bone arthritis, lateral subluxation of tibia. Patient has failed all conservative measures including anti-inflammatory medicines, narcotics, attempts at  exercise and weight loss, cortisone injections and viscosupplementation.  Risks and benefits of surgery have been discussed, questions answered.   DESCRIPTION OF PROCEDURE: The patient identified by armband, received  IV antibiotics, in the holding area at [facility]. Patient taken to the operating room, appropriate anesthetic  monitors were attached, and Spinal anesthesia was  induced. Tourniquet  applied high to the operative thigh. Lateral post and foot positioner  applied to the table, the lower extremity was then prepped and draped   in usual sterile fashion from the toes to the tourniquet. Time-out procedure was performed. We began the operation, with the knee flexed 120 degrees, by making the anterior midline incision starting at handbreadth above the patella going over the patella 1 cm medial to and 4 cm distal to the tibial tubercle. Small bleeders in the skin and the  subcutaneous tissue identified and cauterized. Transverse retinaculum was incised and reflected medially and a medial parapatellar arthrotomy was accomplished. the patella was everted and theprepatellar fat pad resected. The superficial medial collateral  ligament was then elevated from anterior to posterior along the proximal  flare of the tibia and anterior half of the menisci resected. The knee was hyperflexed exposing bone on bone arthritis. Peripheral and notch osteophytes as well as the cruciate ligaments were then resected. We continued to  work our way around posteriorly along the proximal  tibia, and externally  rotated the tibia subluxing it out from underneath the femur. A McHale  retractor was placed through the notch and a lateral Hohmann retractor  placed, and we then drilled through the proximal tibia in line with the  axis of the tibia followed by an intramedullary guide rod and 2-degree  posterior slope cutting guide. The tibial cutting guide, 3 degree posterior sloped, was pinned into place allowing resection of 9 mm of bone medially and 0 mm of bone laterally. Satisfied with the tibial resection, we then  entered the distal femur 2 mm anterior to the PCL origin with the  intramedullary guide rod and applied the distal femoral cutting guide  set at 9 mm, with 5 degrees of valgus. This was pinned along the  epicondylar axis. At this point, the distal femoral cut was accomplished without difficulty. We then sized for a #9L femoral component and pinned the guide in 6 Val degrees of external rotation. The chamfer cutting guide was pinned into place.  The anterior, posterior, and chamfer cuts were accomplished without difficulty followed by  the Attune RP box cutting guide and the box cut. We also removed posterior osteophytes from the posterior femoral condyles. At this  time, the knee was brought into full extension. We checked our  extension and flexion gaps and found them symmetric for a 5 mm bearing. Distracting in extension with a lamina spreader, the posterior horns of the menisci were removed, and Exparel, diluted to 60 cc, with 20cc NS, and 20cc 0.5% Marcaine,was injected into the capsule and synovium of the knee. The posterior patella cut was accomplished with the 9.5 mm Attune cutting guide, sized for a 63mm dome, and the fixation pegs drilled.The knee  was then once again hyperflexed exposing the proximal tibia. We sized for a # 10 tibial base plate, applied the smokestack and the conical reamer followed by the the Delta fin keel punch. We then hammered into place the Attune RP trial femoral component, drilled the lugs, inserted a  5 mm trial bearing, trial patellar button, and took the knee through range of motion from 0-130 degrees. No thumb pressure was required for patellar Tracking. At this point, the limb was wrapped with an Esmarch bandage and the tourniquet inflated to 350 mmHg. All trial components were removed, mating surfaces irrigated with pulse lavage, and dried with suction and sponges. 10 cc of the Exparel solution was applied to the cancellus bone of the patella distal femur and proximal tibia.  After waiting 1 minute, the bony surfaces were again, dried with sponges. A double batch of DePuy HV cement with 1500 mg of Zinacef was mixed and applied to all bony metallic mating surfaces except for the posterior condyles of the femur itself. In order, we hammered into place the tibial tray and removed excess cement, the femoral component and removed excess cement. The final Attune RP bearing  was inserted, and the knee brought to full  extension with compression.  The patellar button was clamped into place, and excess cement  removed. While the cement cured the wound was irrigated out with normal saline solution pulse lavage. Ligament stability and patellar tracking were checked and found to be excellent. The parapatellar arthrotomy was closed with  running #1 Vicryl suture. The subcutaneous tissue with 0 and 2-0 undyed  Vicryl suture, and the skin with running 3-0 SQ vicryl. A dressing of Xeroform,  4 x 4, dressing sponges, Webril, and Ace wrap applied. The patient  awakened,  and taken to recovery room without difficulty.   Kerin Salen 03/23/2018, 2:43 PM

## 2018-03-23 NOTE — Anesthesia Procedure Notes (Signed)
Procedure Name: MAC Performed by: Kearney Evitt B, CRNA Pre-anesthesia Checklist: Patient identified, Emergency Drugs available, Suction available, Patient being monitored and Timeout performed Patient Re-evaluated:Patient Re-evaluated prior to induction Oxygen Delivery Method: Simple face mask Preoxygenation: Pre-oxygenation with 100% oxygen Placement Confirmation: positive ETCO2 Dental Injury: Teeth and Oropharynx as per pre-operative assessment        

## 2018-03-23 NOTE — Evaluation (Signed)
Physical Therapy Evaluation Patient Details Name: [name] MRN: [id number] DOB: 16-Dec-1940 Today's Date: 03/23/2018   History of Present Illness  Pt is a 77 y/o male s/p elective L TKA. PMH includes HTN, prostate cancer, and OSA on CPAP.   Clinical Impression  Pt is s/p surgery above with deficits below. Pt presenting with decreased sensation and incontinence, so mobility limited to chair. Required min to min guard A with use of RW for transfer to chair. Educated about knee precautions and supine HEP. Will continue to follow acutely to maximize functional mobility independence and safety.     Follow Up Recommendations Follow surgeon's recommendation for DC plan and follow-up therapies;Supervision for mobility/OOB    Equipment Recommendations  3in1 (PT)    Recommendations for Other Services OT consult     Precautions / Restrictions Precautions Precautions: Knee Precaution Booklet Issued: Yes (comment) Precaution Comments: Reviewed knee precautions with pt and pt's family. Reviewed supine HEP.  Restrictions Weight Bearing Restrictions: Yes LLE Weight Bearing: Weight bearing as tolerated      Mobility  Bed Mobility Overal bed mobility: Needs Assistance Bed Mobility: Supine to Sit     Supine to sit: Supervision     General bed mobility comments: Supervision for safety. Increased time required.   Transfers Overall transfer level: Needs assistance Equipment used: Rolling walker (2 wheeled) Transfers: Sit to/from [facility] Sit to Stand: Min assist Stand pivot transfers: Min assist;Min guard       General transfer comment: Min A for steadying assist. Verbal cues for safe hand placement. Min guard to min A for steadying throughout transfer to chair. Pt with incontinence and numbness, so mobility limited to chair. Verbal cues for sequencing using RW.   Ambulation/Gait                Stairs            Wheelchair Mobility     Modified Rankin (Stroke Patients Only)       Balance Overall balance assessment: Needs assistance Sitting-balance support: No upper extremity supported;Feet supported Sitting balance-Leahy Scale: Good     Standing balance support: Bilateral upper extremity supported;During functional activity Standing balance-Leahy Scale: Poor Standing balance comment: Reliant on BUE support.                              Pertinent Vitals/Pain Pain Assessment: Faces Faces Pain Scale: Hurts little more Pain Location: L knee  Pain Descriptors / Indicators: Aching;Operative site guarding Pain Intervention(s): Monitored during session;Limited activity within patient's tolerance;Repositioned    Home Living Family/patient expects to be discharged to:: Private residence Living Arrangements: Spouse/significant other Available Help at Discharge: Family;Available 24 hours/day Type of Home: House Home Access: Stairs to enter Entrance Stairs-Rails: Left Entrance Stairs-Number of Steps: 4 Home Layout: Two level;Able to live on main level with bedroom/bathroom(but would like to go upstairs ) Home Equipment: Gilford Rile - 2 wheels      Prior Function Level of Independence: Independent         Comments: Was still working for holiday [employer]       Hand Dominance        Extremity/Trunk Assessment   Upper Extremity Assessment Upper Extremity Assessment: Defer to OT evaluation    Lower Extremity Assessment Lower Extremity Assessment: LLE deficits/detail LLE Deficits / Details: Reports decreased sensation in LLE. Deficits consistent with post op pain and weakness. Able to perform ther ex below.  Cervical / Trunk Assessment Cervical / Trunk Assessment: Normal  Communication   Communication: No difficulties  Cognition Arousal/Alertness: Awake/alert Behavior During Therapy: WFL for tasks assessed/performed Overall Cognitive Status: Within Functional Limits for tasks assessed                                         General Comments General comments (skin integrity, edema, etc.): Pt's wife and daughter present during session.     Exercises Total Joint Exercises Ankle Circles/Pumps: AROM;Both;20 reps Quad Sets: AROM;Left;10 reps Heel Slides: AROM;Left;10 reps   Assessment/Plan    PT Assessment Patient needs continued PT services  PT Problem List Decreased strength;Decreased range of motion;Decreased balance;Decreased mobility;Impaired sensation;Decreased knowledge of precautions;Decreased knowledge of use of DME;Pain       PT Treatment Interventions DME instruction;Gait training;Stair training;Functional mobility training;Therapeutic activities;Therapeutic exercise;Balance training;Patient/family education    PT Goals (Current goals can be found in the Care Plan section)  Acute Rehab PT Goals Patient Stated Goal: "to go home, hopefully tomorrow"  PT Goal Formulation: With patient Time For Goal Achievement: 04/06/18 Potential to Achieve Goals: Good    Frequency 7X/week   Barriers to discharge        Co-evaluation               AM-PAC PT "6 Clicks" Daily Activity  Outcome Measure Difficulty turning over in bed (including adjusting bedclothes, sheets and blankets)?: A Little Difficulty moving from lying on back to sitting on the side of the bed? : A Little Difficulty sitting down on and standing up from a chair with arms (e.g., wheelchair, bedside commode, etc,.)?: Unable Help needed moving to and from a bed to chair (including a wheelchair)?: A Little Help needed walking in hospital room?: A Lot Help needed climbing 3-5 steps with a railing? : A Lot 6 Click Score: 14    End of Session Equipment Utilized During Treatment: Gait belt Activity Tolerance: Patient tolerated treatment well Patient left: in chair;with call bell/phone within reach;with nursing/sitter in room;with family/visitor present Nurse Communication: Mobility  status;Other (comment)(episode of incontinence) PT Visit Diagnosis: Unsteadiness on feet (R26.81);Other abnormalities of gait and mobility (R26.89);Pain Pain - Right/Left: Left Pain - part of body: Knee    Time: 5537-4827 PT Time Calculation (min) (ACUTE ONLY): 23 min   Charges:   PT Evaluation $PT Eval Low Complexity: 1 Low PT Treatments $Therapeutic Activity: 8-22 mins        Leighton Ruff, PT, DPT  Acute Rehabilitation Services  Pager: [phone]   Rudean Hitt 03/23/2018, 6:23 PM

## 2018-03-23 NOTE — Discharge Instructions (Signed)

## 2018-03-23 NOTE — Anesthesia Procedure Notes (Signed)
Anesthesia Regional Block: Adductor canal block   Pre-Anesthetic Checklist: ,, timeout performed, Correct Patient, Correct Site, Correct Laterality, Correct Procedure, Correct Position, site marked, Risks and benefits discussed,  Surgical consent,  Pre-op evaluation,  At surgeon's request and post-op pain management  Laterality: Lower and Left  Prep: chloraprep       Needles:  Injection technique: Single-shot  Needle Type: Echogenic Stimulator Needle          Additional Needles:   Procedures:,,,, ultrasound used (permanent image in chart),,,,  Narrative:  Start time: 03/23/2018 12:29 PM End time: 03/23/2018 12:32 PM Injection made incrementally with aspirations every 5 mL.  Performed by: Personally  Anesthesiologist: Oleta Mouse, MD  Additional Notes: H+P and labs reviewed, risks and benefits discussed with patient, procedure tolerated well without complications

## 2018-03-24 ENCOUNTER — Encounter (HOSPITAL_COMMUNITY): Payer: Self-pay | Admitting: Orthopedic Surgery

## 2018-03-24 DIAGNOSIS — M1712 Unilateral primary osteoarthritis, left knee: Secondary | ICD-10-CM | POA: Diagnosis not present

## 2018-03-24 LAB — BASIC METABOLIC PANEL
Anion gap: 6 (ref 5–15)
BUN: 13 mg/dL (ref 8–23)
CO2: 26 mmol/L (ref 22–32)
Calcium: 8.5 mg/dL — ABNORMAL LOW (ref 8.9–10.3)
Chloride: 106 mmol/L (ref 98–111)
Creatinine, Ser: 1.11 mg/dL (ref 0.61–1.24)
GFR calc Af Amer: >60 mL/min (ref >60)
GFR calc non Af Amer: >60 mL/min (ref >60)
Glucose, Bld: 130 mg/dL — ABNORMAL HIGH (ref 70–99)
Potassium: 4.8 mmol/L (ref 3.5–5.1)
Sodium: 138 mmol/L (ref 135–145)

## 2018-03-24 LAB — CBC
HCT: 35.9 % — ABNORMAL LOW (ref 39.0–52.0)
Hemoglobin: 11.8 g/dL — ABNORMAL LOW (ref 13.0–17.0)
MCH: 30.1 pg (ref 26.0–34.0)
MCHC: 32.9 g/dL (ref 30.0–36.0)
MCV: 91.6 fL (ref 78.0–100.0)
Platelets: 157 10*3/uL (ref 150–400)
RBC: 3.92 MIL/uL — ABNORMAL LOW (ref 4.22–5.81)
RDW: 14.4 % (ref 11.5–15.5)
WBC: 14.7 10*3/uL — ABNORMAL HIGH (ref 4.0–10.5)

## 2018-03-24 NOTE — Progress Notes (Signed)
PATIENT ID: [name]  MRN: [id number]  DOB/AGE:  March 05, 1941 / 77 y.o.  1 Day Post-Op Procedure(s) (LRB): TOTAL KNEE ARTHROPLASTY (Left)    PROGRESS NOTE Subjective: Patient is alert, oriented, no Nausea, no Vomiting, yes passing gas. Taking PO well. Denies SOB, Chest or Calf Pain. Using Incentive Spirometer, PAS in place. Ambulate to bedside commode, Patient reports pain as 4/10 .    Objective: Vital signs in last 24 hours: Vitals:   03/23/18 2013 03/23/18 2019 03/24/18 0039 03/24/18 0417  BP: (Abnormal) 160/85 (Abnormal) 148/74 (Abnormal) 111/57 112/65  Pulse: 79 73 73 78  Resp: 18  18 18   Temp: 98 F (36.7 C)  98 F (36.7 C) 99.4 F (37.4 C)  TempSrc: Oral   Oral  SpO2:   97% 94%  Weight:      Height:          Intake/Output from previous day: I/O last 3 completed shifts: In: 1000 [I.V.:1000] Out: 375 [Urine:300; Blood:75]   Intake/Output this shift: Total I/O In: -  Out: 300 [Urine:300]   LABORATORY DATA: Recent Labs    03/24/18 0403  WBC 14.7*  HGB 11.8*  HCT 35.9*  PLT 157  NA 138  K 4.8  CL 106  CO2 26  BUN 13  CREATININE 1.11  GLUCOSE 130*  CALCIUM 8.5*    Examination: Neurologically intact ABD soft Neurovascular intact Sensation intact distally Intact pulses distally Dorsiflexion/Plantar flexion intact Incision: scant drainage No cellulitis present Compartment soft}  Assessment:   1 Day Post-Op Procedure(s) (LRB): TOTAL KNEE ARTHROPLASTY (Left) ADDITIONAL DIAGNOSIS: Expected Acute Blood Loss Anemia, RBBB Anticipated LOS equal to or greater than 2 midnights due to - Age 75 and older with one or more of the following:  - Obesity  - Expected need for hospital services (PT, OT, Nursing) required for safe  discharge     Plan: PT/OT WBAT, AROM and PROM  DVT Prophylaxis:  SCDx72hrs, ASA 81 mg BID x 2 weeks DISCHARGE PLAN: Home, when passes PT goals, if today will be code [place]: HHPT, Walker and 3-in-1 comode  seat     Kerin Salen 03/24/2018, 6:06 AM

## 2018-03-24 NOTE — Care Management Note (Signed)
Case Management Note  Patient Details  Name: MALICK NETZ MRN: [id number] Date of Birth: Apr 17, 1941  Subjective/Objective:  77 yr old male s/p left total knee arthroplasty.                   Action/Plan: Patient was preoperatively setup with [facility], no changes. Will have support at discharge..    Expected Discharge Date:    03/25/18              Expected Discharge Plan:  [place]  In-House Referral:  NA  Discharge planning Services  CM Consult  Post Acute Care Choice:  Home Health Choice offered to:  Patient  DME Arranged:  (has rolling walker, declines 3in1) DME Agency:  NA  HH Arranged:  PT HH Agency:  [facility] (formerly [facility])  Status of Service:  Completed, signed off  If discussed at [employer], dates discussed:    Additional Comments:  Ninfa Meeker, RN 03/24/2018, 1:20 PM

## 2018-03-24 NOTE — Progress Notes (Signed)
RT placed pt on cpap and pt tolerating well. EPAP of 8, pt has home mask and hospital machine. Educated to call if needed anything. H2O placed in chamber.

## 2018-03-24 NOTE — Progress Notes (Signed)
Physical Therapy Treatment Patient Details Name: [name] MRN: [id number] DOB: 08/14/1941 Today's Date: 03/24/2018    History of Present Illness Pt is a 77 y/o male s/p elective L TKA. PMH includes HTN, prostate cancer, and OSA on CPAP.     PT Comments    Pt slow and guarded due to pain.  Pt is unsafe with use of device despite constant cueing to use correctly. He is requiring min to moderate assistance and will likely need another day to improve and better manage his pain before returning home.      Follow Up Recommendations  Follow surgeon's recommendation for DC plan and follow-up therapies;Supervision for mobility/OOB     Equipment Recommendations  3in1 (PT)    Recommendations for Other Services       Precautions / Restrictions Precautions Precautions: Knee Precaution Comments: Reviewed knee precautions with pt and pt's family. Reviewed supine HEP.  Restrictions Weight Bearing Restrictions: Yes LLE Weight Bearing: Weight bearing as tolerated    Mobility  Bed Mobility               General bed mobility comments: Pt in recliner on arrival.    Transfers Overall transfer level: Needs assistance Equipment used: Rolling walker (2 wheeled) Transfers: Sit to/from Stand Sit to Stand: Min assist         General transfer comment: Cues for hand placement to and from seated surface.  Min assistance to boost into standing.    Ambulation/Gait Ambulation/Gait assistance: Mod assist Gait Distance (Feet): 70 Feet Assistive device: Rolling walker (2 wheeled) Gait Pattern/deviations: Step-to pattern;Shuffle;Trunk flexed;Antalgic;Decreased stance time - left;Decreased step length - right     General Gait Details: Cues for upper trunk extension and hip extension.  Pt with flexed pattern.  Cues to keep RW closer.     Stairs             Wheelchair Mobility    Modified Rankin (Stroke Patients Only)       Balance Overall balance assessment: Needs  assistance   Sitting balance-Leahy Scale: Good       Standing balance-Leahy Scale: Poor                              Cognition Arousal/Alertness: Awake/alert Behavior During Therapy: WFL for tasks assessed/performed Overall Cognitive Status: Within Functional Limits for tasks assessed                                        Exercises Total Joint Exercises Ankle Circles/Pumps: AROM;Both;20 reps;Supine Quad Sets: AROM;Left;10 reps;Supine Heel Slides: Left;10 reps;Supine;AAROM Hip ABduction/ADduction: AROM;Left;10 reps;Supine Straight Leg Raises: Left;Supine;AAROM;10 reps Goniometric ROM: 8-76 degrees.     General Comments        Pertinent Vitals/Pain Pain Assessment: 0-10 Pain Location: L knee  Pain Descriptors / Indicators: Aching;Operative site guarding Pain Intervention(s): Monitored during session;Repositioned    Home Living                      Prior Function            PT Goals (current goals can now be found in the care plan section) Acute Rehab PT Goals Patient Stated Goal: To go home today.   Progress towards PT goals: Progressing toward goals    Frequency    7X/week  PT Plan Current plan remains appropriate    Co-evaluation              AM-PAC PT "6 Clicks" Daily Activity  Outcome Measure  Difficulty turning over in bed (including adjusting bedclothes, sheets and blankets)?: Unable Difficulty moving from lying on back to sitting on the side of the bed? : Unable Difficulty sitting down on and standing up from a chair with arms (e.g., wheelchair, bedside commode, etc,.)?: Unable Help needed moving to and from a bed to chair (including a wheelchair)?: A Little Help needed walking in hospital room?: A Lot Help needed climbing 3-5 steps with a railing? : A Lot 6 Click Score: 10    End of Session Equipment Utilized During Treatment: Gait belt Activity Tolerance: Patient tolerated treatment  well Patient left: in chair;with call bell/phone within reach;with nursing/sitter in room;with family/visitor present Nurse Communication: Mobility status PT Visit Diagnosis: Unsteadiness on feet (R26.81);Other abnormalities of gait and mobility (R26.89);Pain Pain - Right/Left: Left Pain - part of body: Knee     Time: 9379-0240 PT Time Calculation (min) (ACUTE ONLY): 25 min  Charges:  $Gait Training: 8-22 mins $Therapeutic Exercise: 8-22 mins                     Governor Rooks, PTA pager [phone]    Cristela Blue 03/24/2018, 10:54 AM

## 2018-03-24 NOTE — Anesthesia Postprocedure Evaluation (Signed)
Anesthesia Post Note  Patient: [name]  Procedure(s) Performed: TOTAL KNEE ARTHROPLASTY (Left Knee)     Patient location during evaluation: PACU Anesthesia Type: Regional, MAC and Spinal Level of consciousness: awake and alert Pain management: pain level controlled Vital Signs Assessment: post-procedure vital signs reviewed and stable Respiratory status: spontaneous breathing, nonlabored ventilation, respiratory function stable and patient connected to nasal cannula oxygen Cardiovascular status: stable and blood pressure returned to baseline Postop Assessment: no apparent nausea or vomiting and spinal receding Anesthetic complications: no    Last Vitals:  Vitals:   03/24/18 1455 03/24/18 2138  BP: 139/75 132/75  Pulse: 89 88  Resp: 17 16  Temp: 36.7 C 37 C  SpO2: 91% 94%    Last Pain:  Vitals:   03/24/18 2138  TempSrc: Oral  PainSc:                  Lorry Anastasi

## 2018-03-24 NOTE — Progress Notes (Signed)
Physical Therapy Treatment Patient Details Name: [name] MRN: [id number] DOB: [birth year] Today's Date: 03/24/2018    History of Present Illness Pt is a 77 y/o male s/p elective L TKA. PMH includes HTN, prostate cancer, and OSA on CPAP.     PT Comments    Pt performed gait training and transfers with decreased assistance from this am.  He did require increased assistance for a brief period after buckling x1 during gait and x1 during stair training. During these buckling episodes he required moderate assistance to correct.  Plan for re-trial of stairs in am.  Pt is progressing slowly and would benefit from another day for strength and function to improve before returning home with support from his spouse.    Follow Up Recommendations  Follow surgeon's recommendation for DC plan and follow-up therapies;Supervision for mobility/OOB     Equipment Recommendations  3in1 (PT)    Recommendations for Other Services       Precautions / Restrictions Precautions Precautions: Knee Precaution Booklet Issued: Yes (comment) Precaution Comments: Reviewed knee precautions with pt and pt's family. Reviewed supine HEP.  Restrictions Weight Bearing Restrictions: Yes LLE Weight Bearing: Weight bearing as tolerated    Mobility  Bed Mobility               General bed mobility comments: Pt in recliner on arrival.    Transfers Overall transfer level: Needs assistance Equipment used: Rolling walker (2 wheeled) Transfers: Sit to/from Stand Sit to Stand: Min assist         General transfer comment: Cues for hand placement to and from seated surface.  Min assistance to boost into standing.    Ambulation/Gait Ambulation/Gait assistance: Min assist;+2 safety/equipment;Mod assist(for chair follow) Gait Distance (Feet): 130 Feet Assistive device: Rolling walker (2 wheeled) Gait Pattern/deviations: Step-to pattern;Shuffle;Trunk flexed;Antalgic;Decreased stance time - left;Decreased  step length - right     General Gait Details: Cues for upper trunk extension and hip extension.  Pt with flexed pattern.  Cues to keep RW closer but did perform with improved technique. Close chair follow with one instance of knee buckling in L stance phase.     Stairs Stairs: Yes Stairs assistance: Min assist;Mod assist Stair Management: One rail Left Number of Stairs: 2 General stair comments: After descending 2nd stair patient with L knee buckling and required quick return to recliner.     Wheelchair Mobility    Modified Rankin (Stroke Patients Only)       Balance Overall balance assessment: Needs assistance Sitting-balance support: No upper extremity supported;Feet supported Sitting balance-Leahy Scale: Good     Standing balance support: Bilateral upper extremity supported;During functional activity Standing balance-Leahy Scale: Poor Standing balance comment: Reliant on BUE support.                             Cognition Arousal/Alertness: Awake/alert Behavior During Therapy: WFL for tasks assessed/performed Overall Cognitive Status: Within Functional Limits for tasks assessed                                        Exercises      General Comments        Pertinent Vitals/Pain Pain Assessment: 0-10 Pain Location: L knee and thigh Pain Descriptors / Indicators: Aching;Operative site guarding Pain Intervention(s): Monitored during session;Repositioned    Home Living  Prior Function            PT Goals (current goals can now be found in the care plan section) Acute Rehab PT Goals Patient Stated Goal: To go home today.   Potential to Achieve Goals: Good Progress towards PT goals: Progressing toward goals    Frequency    7X/week      PT Plan Current plan remains appropriate    Co-evaluation              AM-PAC PT "6 Clicks" Daily Activity  Outcome Measure  Difficulty turning over  in bed (including adjusting bedclothes, sheets and blankets)?: Unable Difficulty moving from lying on back to sitting on the side of the bed? : Unable Difficulty sitting down on and standing up from a chair with arms (e.g., wheelchair, bedside commode, etc,.)?: Unable Help needed moving to and from a bed to chair (including a wheelchair)?: A Little Help needed walking in hospital room?: A Lot Help needed climbing 3-5 steps with a railing? : A Lot 6 Click Score: 10    End of Session Equipment Utilized During Treatment: Gait belt Activity Tolerance: Patient tolerated treatment well Patient left: in chair;with call bell/phone within reach;with nursing/sitter in room;with family/visitor present Nurse Communication: Mobility status PT Visit Diagnosis: Unsteadiness on feet (R26.81);Other abnormalities of gait and mobility (R26.89);Pain Pain - Right/Left: Left Pain - part of body: Knee     Time: 7124-5809 PT Time Calculation (min) (ACUTE ONLY): 23 min  Charges:  $Gait Training: 8-22 mins $Therapeutic Activity: 8-22 mins                     [name], PTA pager [phone]    [name] 03/24/2018, 5:00 PM

## 2018-03-24 NOTE — Care Management Obs Status (Signed)
[facility] NOTIFICATION   Patient Details  Name: [name] MRN: [id number] Date of Birth: June 23, 1941   Medicare Observation Status Notification Given:       Maryclare Labrador, RN 03/24/2018, 2:24 PM

## 2018-03-24 NOTE — Care Management Obs Status (Signed)
[facility] NOTIFICATION   Patient Details  Name: [name] MRN: [id number] Date of Birth: 1941/02/04   Medicare Observation Status Notification Given:  Yes    Maryclare Labrador, RN 03/24/2018, 2:24 PM

## 2018-03-25 DIAGNOSIS — M1712 Unilateral primary osteoarthritis, left knee: Secondary | ICD-10-CM | POA: Diagnosis not present

## 2018-03-25 LAB — CBC
HCT: 34.8 % — ABNORMAL LOW (ref 39.0–52.0)
Hemoglobin: 11.5 g/dL — ABNORMAL LOW (ref 13.0–17.0)
MCH: 30 pg (ref 26.0–34.0)
MCHC: 33 g/dL (ref 30.0–36.0)
MCV: 90.9 fL (ref 78.0–100.0)
Platelets: 154 10*3/uL (ref 150–400)
RBC: 3.83 MIL/uL — ABNORMAL LOW (ref 4.22–5.81)
RDW: 14.6 % (ref 11.5–15.5)
WBC: 19.3 10*3/uL — ABNORMAL HIGH (ref 4.0–10.5)

## 2018-03-25 NOTE — Progress Notes (Signed)
PATIENT ID: [name]  MRN: [id number]  DOB/AGE:  02-12-1941 / 77 y.o.  2 Days Post-Op Procedure(s) (LRB): TOTAL KNEE ARTHROPLASTY (Left)    PROGRESS NOTE Subjective: Patient is alert, oriented, no Nausea, no Vomiting, yes passing gas. Taking PO well. Denies SOB, Chest or Calf Pain. Using Incentive Spirometer, PAS in place. Ambulate WBAT with pt walking 130 ft with therapy, Patient reports pain as none at rest and moderate with therapy .    Objective: Vital signs in last 24 hours: Vitals:   03/24/18 1030 03/24/18 1455 03/24/18 2138 03/25/18 0456  BP: 112/65 139/75 132/75 (!) 161/78  Pulse:  89 88 92  Resp:  17 16 16   Temp:  98.1 F (36.7 C) 98.6 F (37 C) 98.7 F (37.1 C)  TempSrc:  Oral Oral Oral  SpO2:  91% 94% 95%  Weight:      Height:          Intake/Output from previous day: I/O last 3 completed shifts: In: 480 [P.O.:480] Out: 1200 [Urine:1200]   Intake/Output this shift: No intake/output data recorded.   LABORATORY DATA: Recent Labs    03/24/18 0403 03/25/18 0434  WBC 14.7* 19.3*  HGB 11.8* 11.5*  HCT 35.9* 34.8*  PLT 157 154  NA 138  --   K 4.8  --   CL 106  --   CO2 26  --   BUN 13  --   CREATININE 1.11  --   GLUCOSE 130*  --   CALCIUM 8.5*  --     Examination: Neurologically intact Neurovascular intact Sensation intact distally Intact pulses distally Dorsiflexion/Plantar flexion intact Incision: dressing C/D/I and no drainage No cellulitis present Compartment soft}  Assessment:   2 Days Post-Op Procedure(s) (LRB): TOTAL KNEE ARTHROPLASTY (Left) ADDITIONAL DIAGNOSIS: Expected Acute Blood Loss Anemia, Sleep Apnea Anticipated LOS equal to or greater than 2 midnights due to - Age 77 and older with one or more of the following:  - Obesity  - Expected need for hospital services (PT, OT, Nursing) required for safe  discharge   Plan: PT/OT WBAT, AROM and PROM  DVT Prophylaxis:  SCDx72hrs, ASA 81 mg BID x 2 weeks DISCHARGE PLAN:  Home, today DISCHARGE NEEDS: HHPT, Walker and 3-in-1 comode seat     Joanell Rising 03/25/2018, 8:03 AM

## 2018-03-25 NOTE — Discharge Summary (Signed)
Patient ID: [name] MRN: [id number] DOB/AGE: April 07, 1941 77 y.o.  Admit date: 03/23/2018 Discharge date: 03/25/2018  Admission Diagnoses:  Principal Problem:   Degenerative arthritis of left knee Active Problems:   Primary osteoarthritis of left knee   Discharge Diagnoses:  Same  Past Medical History:  Diagnosis Date  . Arthritis    "all over" (03/23/2018)  . Chronic lower back pain   . Curvature of spine   . High cholesterol   . Hypertension   . Hypothyroidism   . 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 OSA on CPAP   . PONV (postoperative nausea and vomiting)    "just w/back OR in ~ 1993"  . Prostate CA ([place]) 2001  . Thyroid cancer ([place]) 02/10/2017    Surgeries: Procedure(s): TOTAL KNEE ARTHROPLASTY on 03/23/2018   Consultants:   Discharged Condition: Improved  Hospital Course: [name] is an 77 y.o. male who was admitted 03/23/2018 for operative treatment ofDegenerative arthritis of left knee. Patient has severe unremitting pain that affects sleep, daily activities, and work/hobbies. After pre-op clearance the patient was taken to the operating room on 03/23/2018 and underwent  Procedure(s): TOTAL KNEE ARTHROPLASTY.    Patient was given perioperative antibiotics:  Anti-infectives (From admission, onward)   Start     Dose/Rate Route Frequency Ordered Stop   03/23/18 1030  ceFAZolin (ANCEF) IVPB 2g/100 mL premix     2 g 200 mL/hr over 30 Minutes Intravenous On call to O.R. 03/23/18 1027 03/23/18 1302       Patient was given sequential compression devices, early ambulation, and chemoprophylaxis to prevent DVT.  Patient benefited maximally from hospital stay and there were no complications.    Recent vital signs:  Patient Vitals for the past 24 hrs:  BP Temp Temp src Pulse Resp SpO2  03/25/18 0456 (!) 161/78 98.7 F (37.1 C) Oral 92 16 95 %  03/24/18 2138 132/75 98.6 F (37 C) Oral 88 16 94 %  03/24/18 1455 139/75 98.1 F (36.7 C) Oral 89  17 91 %  03/24/18 1030 112/65 - - - - -     Recent laboratory studies:  Recent Labs    03/24/18 0403 03/25/18 0434  WBC 14.7* 19.3*  HGB 11.8* 11.5*  HCT 35.9* 34.8*  PLT 157 154  NA 138  --   K 4.8  --   CL 106  --   CO2 26  --   BUN 13  --   CREATININE 1.11  --   GLUCOSE 130*  --   CALCIUM 8.5*  --      Discharge Medications:   Allergies as of 03/25/2018      Reactions   Codeine Other (See Comments)   Makes the patient "feel weird"      Medication List    STOP taking these medications   acetaminophen 650 MG CR tablet Commonly known as:  TYLENOL     TAKE these medications   aspirin EC 81 MG tablet Take 1 tablet (81 mg total) by mouth 2 (two) times daily. What changed:  when to take this   CoQ10 100 MG Caps Take 1 capsule by mouth daily.   FISH OIL ULTRA PO Take 1 capsule by mouth daily.   levothyroxine 175 MCG tablet Commonly known as:  SYNTHROID, LEVOTHROID Take 175 mcg by mouth daily before breakfast.   lisinopril 20 MG tablet Commonly known as:  PRINIVIL,ZESTRIL Take 20 mg by mouth daily.   ONE-A-DAY MENS 50+ ADVANTAGE  Tabs Take 1 tablet by mouth daily.   oxyCODONE-acetaminophen 5-325 MG tablet Commonly known as:  PERCOCET/ROXICET Take 1 tablet by mouth every 4 (four) hours as needed for severe pain.   simvastatin 40 MG tablet Commonly known as:  ZOCOR Take 40 mg by mouth daily at 6 PM.   timolol 0.5 % ophthalmic solution Commonly known as:  BETIMOL Place 1 drop into both eyes daily.   tiZANidine 2 MG tablet Commonly known as:  ZANAFLEX Take 1 tablet (2 mg total) by mouth every 6 (six) hours as needed.   Turmeric 500 MG Caps Take 1 capsule by mouth 2 (two) times daily.   vitamin B-12 1000 MCG tablet Commonly known as:  CYANOCOBALAMIN Take 1,000 mcg by mouth daily.   VITAMIN C PO Take 1 tablet by mouth daily.   VITAMIN D-3 PO Take 1 capsule by mouth daily.            Durable Medical Equipment  (From admission, onward)         Start     Ordered   03/23/18 1704  DME Walker rolling  Once    Question:  Patient needs a walker to treat with the following condition  Answer:  Status post total left knee replacement   03/23/18 1703   03/23/18 1704  DME 3 n 1  Once     03/23/18 1703       Discharge Care Instructions  (From admission, onward)        Start     Ordered   03/25/18 0000  Weight bearing as tolerated     03/25/18 0806      Diagnostic Studies: Dg Chest 2 View  Result Date: 03/15/2018 CLINICAL DATA:  Preoperative for knee replacement surgery. History of hypertension, sleep apnea, prostate and thyroid cancer. EXAM: CHEST - 2 VIEW COMPARISON:  02/10/2017 FINDINGS: Normal heart size and pulmonary vascularity. No focal airspace disease or consolidation in the lungs. No blunting of costophrenic angles. No pneumothorax. Mediastinal contours appear intact. Calcified and tortuous aorta. IMPRESSION: No evidence of active pulmonary disease. Aortic atherosclerosis. Electronically Signed   By: Lucienne Capers M.D.   On: 03/15/2018 21:56   US Thyroid  Result Date: 03/22/2018 CLINICAL DATA:  Other. 77 year old male with a history of thyroid cancer status post total thyroidectomy. EXAM: THYROID ULTRASOUND TECHNIQUE: Ultrasound examination of the thyroid gland and adjacent soft tissues was performed. COMPARISON:  Prior ultrasound-guided thyroid nodule and left cervical lymph node biopsies 12/29/2016 FINDINGS: The thyroid gland is surgically absent. No evidence of residual thyroid tissue in the right resection bed. However, in the left thyroid resection bed there is a hypoechoic nodule of solid tissue measuring 1.3 x 0.9 x 1.1 cm. There is evidence of internal vascularity. No additional thyroid nodules or lymphadenopathy are identified. IMPRESSION: Approximately 1.3 x 0.9 x 1.1 cm lobule of soft tissue in the left thyroid resection bed most consistent with residual thyroid tissue. Surgical changes of total thyroidectomy.  Electronically Signed   By: Jacqulynn Cadet M.D.   On: 03/22/2018 13:28    Disposition: Discharge disposition: 01-Home or Self Care       Discharge Instructions    Call MD / Call 911   Complete by:  As directed    If you experience chest pain or shortness of breath, CALL 911 and be transported to the hospital emergency room.  If you develope a fever above 101 F, pus (white drainage) or increased drainage or redness at the wound, or  calf pain, call your surgeon's office.   Constipation Prevention   Complete by:  As directed    Drink plenty of fluids.  Prune juice may be helpful.  You may use a stool softener, such as Colace (over the counter) 100 mg twice a day.  Use MiraLax (over the counter) for constipation as needed.   Diet - low sodium heart healthy   Complete by:  As directed    Driving restrictions   Complete by:  As directed    No driving for 2 weeks   Increase activity slowly as tolerated   Complete by:  As directed    Patient may shower   Complete by:  As directed    You may shower without a dressing once there is no drainage.  Do not wash over the wound.  If drainage remains, cover wound with plastic wrap and then shower.   Weight bearing as tolerated   Complete by:  As directed       Follow-up Information    Frederik Pear, MD In 2 weeks.   Specialty:  Orthopedic Surgery Contact information: [place] [address] [phone]        [facility], [facility] Follow up.   Specialty:  [place] [place] Why:  A representative from [facility] will contact you to arrange start date and time for your therapy. Contact information: [address] [place] [address] [phone]            Signed: Joanell Rising 03/25/2018, 8:06 AM

## 2018-03-25 NOTE — Progress Notes (Signed)
Physical Therapy Treatment Patient Details Name: [name] MRN: [id number] DOB: Oct 09, 1940 Today's Date: 03/25/2018    History of Present Illness Pt is a 77 y/o male s/p elective L TKA. PMH includes HTN, prostate cancer, and OSA on CPAP.     PT Comments    Pt performed gait training followed by stair training.  Pt slow and guarded due to pain but no buckling noted.  Plan to return for pm session to progress LE exercises before returning home.      Follow Up Recommendations  Follow surgeon's recommendation for DC plan and follow-up therapies;Supervision for mobility/OOB     Equipment Recommendations  3in1 (PT)    Recommendations for Other Services OT consult     Precautions / Restrictions Precautions Precautions: Knee Precaution Booklet Issued: Yes (comment) Precaution Comments: Reviewed knee precautions with pt and pt's family. Reviewed supine HEP.  Restrictions Weight Bearing Restrictions: Yes LLE Weight Bearing: Weight bearing as tolerated    Mobility  Bed Mobility Overal bed mobility: Needs Assistance Bed Mobility: Supine to Sit     Supine to sit: Supervision     General bed mobility comments: Pt in recliner on arrival.    Transfers Overall transfer level: Needs assistance Equipment used: Rolling walker (2 wheeled) Transfers: Sit to/from Stand Sit to Stand: Min guard         General transfer comment: Cues for hand placement to and from seated surface.  Min guard assistance for safety.    Ambulation/Gait Ambulation/Gait assistance: Min guard Gait Distance (Feet): 150 Feet Assistive device: Rolling walker (2 wheeled) Gait Pattern/deviations: Step-to pattern;Shuffle;Trunk flexed;Antalgic;Decreased stance time - left;Decreased step length - right;Step-through pattern     General Gait Details: Cues for upper trunk control and increased B stride length.  Pt able to progress to step through pattern.     Stairs Stairs: Yes Stairs assistance: Min  guard Stair Management: Step to pattern Number of Stairs: 4 General stair comments: Cues for sequencing and hand placement on railing.  Pt fatigued but performed safely.     Wheelchair Mobility    Modified Rankin (Stroke Patients Only)       Balance Overall balance assessment: Needs assistance   Sitting balance-Leahy Scale: Good       Standing balance-Leahy Scale: Poor Standing balance comment: Reliant on BUE support.                             Cognition Arousal/Alertness: Awake/alert Behavior During Therapy: WFL for tasks assessed/performed Overall Cognitive Status: Within Functional Limits for tasks assessed                                        Exercises Total Joint Exercises Ankle Circles/Pumps: AROM;Both;20 reps;Supine Quad Sets: AROM;Left;10 reps;Supine Heel Slides: Left;10 reps;Supine;AAROM Hip ABduction/ADduction: AROM;Left;10 reps;Supine;AAROM Straight Leg Raises: Left;Supine;AAROM;10 reps    General Comments        Pertinent Vitals/Pain Pain Assessment: 0-10 Pain Score: 8  Pain Location: L knee and thigh Pain Descriptors / Indicators: Aching;Operative site guarding Pain Intervention(s): Monitored during session;Repositioned;Ice applied    Home Living                      Prior Function            PT Goals (current goals can now be found in the care plan  section) Acute Rehab PT Goals Patient Stated Goal: To go home today.   Potential to Achieve Goals: Good Progress towards PT goals: Progressing toward goals    Frequency    7X/week      PT Plan Current plan remains appropriate    Co-evaluation              AM-PAC PT "6 Clicks" Daily Activity  Outcome Measure  Difficulty turning over in bed (including adjusting bedclothes, sheets and blankets)?: A Little Difficulty moving from lying on back to sitting on the side of the bed? : A Little Difficulty sitting down on and standing up from a  chair with arms (e.g., wheelchair, bedside commode, etc,.)?: A Little Help needed moving to and from a bed to chair (including a wheelchair)?: A Little Help needed walking in hospital room?: A Little Help needed climbing 3-5 steps with a railing? : A Little 6 Click Score: 18    End of Session Equipment Utilized During Treatment: Gait belt Activity Tolerance: Patient tolerated treatment well Patient left: in chair;with call bell/phone within reach;with nursing/sitter in room;with family/visitor present Nurse Communication: Mobility status PT Visit Diagnosis: Unsteadiness on feet (R26.81);Other abnormalities of gait and mobility (R26.89);Pain Pain - Right/Left: Left Pain - part of body: Knee     Time: 9507-2257 PT Time Calculation (min) (ACUTE ONLY): 20 min  Charges:  $Gait Training: 8-22 mins                     Governor Rooks, PTA pager [phone]    Cristela Blue 03/25/2018, 11:45 AM

## 2018-03-25 NOTE — Progress Notes (Signed)
Physical Therapy Treatment Patient Details Name: [name] MRN: [id number] DOB: 1940/09/22 Today's Date: 03/25/2018    History of Present Illness Pt is a 77 y/o male s/p elective L TKA. PMH includes HTN, prostate cancer, and OSA on CPAP.     PT Comments    Pt performed increased gait and reviewed HEP in its entirety.  Informed nursing he is ready for d/c home.  Answered all questions to patient and his wife.  He will follow up with HHPT at d/c.  Follow Up Recommendations  Follow surgeon's recommendation for DC plan and follow-up therapies;Supervision for mobility/OOB     Equipment Recommendations  3in1 (PT)    Recommendations for Other Services OT consult     Precautions / Restrictions Precautions Precautions: Knee Precaution Booklet Issued: Yes (comment) Precaution Comments: Reviewed knee precautions with pt and pt's family. Reviewed supine HEP.  Restrictions Weight Bearing Restrictions: Yes LLE Weight Bearing: Weight bearing as tolerated    Mobility  Bed Mobility Overal bed mobility: Needs Assistance Bed Mobility: Supine to Sit     Supine to sit: Supervision     General bed mobility comments: Pt in recliner on arrival.    Transfers Overall transfer level: Needs assistance Equipment used: Rolling walker (2 wheeled) Transfers: Sit to/from Stand Sit to Stand: Supervision         General transfer comment: Cues for hand placement to and from seated surface. Supervision for safety with poor eccentric loading.    Ambulation/Gait Ambulation/Gait assistance: Supervision Gait Distance (Feet): 250 Feet Assistive device: Rolling walker (2 wheeled) Gait Pattern/deviations: Shuffle;Trunk flexed;Antalgic;Decreased stance time - left;Decreased step length - right;Step-through pattern     General Gait Details: Remains to require constant cueing for upper trunk control and posturing.  Good maintainence of step through sequencing.     Stairs Stairs: Yes Stairs  assistance: Min guard Stair Management: Step to pattern Number of Stairs: 4 General stair comments: Cues for sequencing and hand placement on railing.  Pt fatigued but performed safely.     Wheelchair Mobility    Modified Rankin (Stroke Patients Only)       Balance Overall balance assessment: Needs assistance Sitting-balance support: No upper extremity supported;Feet supported Sitting balance-Leahy Scale: Good     Standing balance support: Bilateral upper extremity supported;During functional activity Standing balance-Leahy Scale: Poor Standing balance comment: Reliant on BUE support.                             Cognition Arousal/Alertness: Awake/alert Behavior During Therapy: WFL for tasks assessed/performed Overall Cognitive Status: Within Functional Limits for tasks assessed                                        Exercises Total Joint Exercises Ankle Circles/Pumps: AROM;Both;20 reps;Supine Quad Sets: AROM;Left;10 reps;Supine Towel Squeeze: AROM;Both;10 reps;Supine Short Arc Quad: AROM;Left;10 reps;Supine Heel Slides: Left;10 reps;Supine;AAROM Hip ABduction/ADduction: AROM;Left;10 reps;Supine;AAROM Straight Leg Raises: Left;Supine;AAROM;10 reps Long Arc Quad: AROM;Left;10 reps;Seated Knee Flexion: AROM;AAROM;Left;10 reps;Seated(1x10 arom and 1x10 aarom) Goniometric ROM: 7-91 degrees flexion.      General Comments        Pertinent Vitals/Pain Pain Assessment: 0-10 Pain Score: 5  Pain Location: L knee and thigh Pain Descriptors / Indicators: Aching;Operative site guarding Pain Intervention(s): Monitored during session;Repositioned    Home Living  Prior Function            PT Goals (current goals can now be found in the care plan section) Acute Rehab PT Goals Patient Stated Goal: To go home today.   Potential to Achieve Goals: Good Progress towards PT goals: Progressing toward goals     Frequency    7X/week      PT Plan Current plan remains appropriate    Co-evaluation              AM-PAC PT "6 Clicks" Daily Activity  Outcome Measure  Difficulty turning over in bed (including adjusting bedclothes, sheets and blankets)?: None Difficulty moving from lying on back to sitting on the side of the bed? : None Difficulty sitting down on and standing up from a chair with arms (e.g., wheelchair, bedside commode, etc,.)?: None Help needed moving to and from a bed to chair (including a wheelchair)?: A Little Help needed walking in hospital room?: A Little Help needed climbing 3-5 steps with a railing? : A Little 6 Click Score: 21    End of Session Equipment Utilized During Treatment: Gait belt Activity Tolerance: Patient tolerated treatment well Patient left: in chair;with call bell/phone within reach;with nursing/sitter in room;with family/visitor present Nurse Communication: Mobility status PT Visit Diagnosis: Unsteadiness on feet (R26.81);Other abnormalities of gait and mobility (R26.89);Pain Pain - Right/Left: Left Pain - part of body: Knee     Time: 1325-1345 PT Time Calculation (min) (ACUTE ONLY): 20 min  Charges:  $Gait Training: 8-22 mins                     Governor Rooks, PTA pager [phone]    Cristela Blue 03/25/2018, 1:51 PM

## 2018-03-25 NOTE — Progress Notes (Signed)
Discharge instructions completed with pt.  Pt verbalized understanding of the information.  Pt denies chest pain, shortness of breath, dizziness, lightheadedness, and n/v.  Pt's IV d/c'ed. Pt discharged home.  

## 2018-08-09 ENCOUNTER — Other Ambulatory Visit: Payer: Self-pay | Admitting: Internal Medicine

## 2018-08-09 DIAGNOSIS — C73 Malignant neoplasm of thyroid gland: Secondary | ICD-10-CM

## 2018-08-09 DIAGNOSIS — E892 Postprocedural hypoparathyroidism: Secondary | ICD-10-CM

## 2018-08-09 DIAGNOSIS — C770 Secondary and unspecified malignant neoplasm of lymph nodes of head, face and neck: Secondary | ICD-10-CM

## 2018-10-03 ENCOUNTER — Ambulatory Visit
Admission: RE | Admit: 2018-10-03 | Discharge: 2018-10-03 | Disposition: A | Discharge status: Home / Self Care | Payer: Medicare Other | Source: Ambulatory Visit | Attending: Internal Medicine | Admitting: Internal Medicine

## 2018-10-03 DIAGNOSIS — C73 Malignant neoplasm of thyroid gland: Secondary | ICD-10-CM

## 2018-10-03 DIAGNOSIS — C770 Secondary and unspecified malignant neoplasm of lymph nodes of head, face and neck: Secondary | ICD-10-CM

## 2018-10-03 DIAGNOSIS — E892 Postprocedural hypoparathyroidism: Secondary | ICD-10-CM

## 2018-10-27 ENCOUNTER — Other Ambulatory Visit (HOSPITAL_COMMUNITY): Payer: Self-pay | Admitting: Internal Medicine

## 2018-10-27 ENCOUNTER — Other Ambulatory Visit: Payer: Self-pay | Admitting: Internal Medicine

## 2018-10-27 DIAGNOSIS — C73 Malignant neoplasm of thyroid gland: Secondary | ICD-10-CM

## 2018-11-02 ENCOUNTER — Encounter (HOSPITAL_COMMUNITY)
Admission: RE | Admit: 2018-11-02 | Discharge: 2018-11-02 | Disposition: A | Discharge status: Home / Self Care | Payer: Medicare Other | Source: Ambulatory Visit | Attending: Internal Medicine | Admitting: Internal Medicine

## 2018-11-02 DIAGNOSIS — C73 Malignant neoplasm of thyroid gland: Secondary | ICD-10-CM | POA: Diagnosis present

## 2018-11-02 MED ORDER — STERILE WATER FOR INJECTION IJ SOLN
INTRAMUSCULAR | Status: AC
  Filled 2018-11-02: qty 10

## 2018-11-03 ENCOUNTER — Encounter (HOSPITAL_COMMUNITY)
Admission: RE | Admit: 2018-11-03 | Discharge: 2018-11-03 | Disposition: A | Discharge status: Home / Self Care | Payer: Medicare Other | Source: Ambulatory Visit | Attending: Internal Medicine | Admitting: Internal Medicine

## 2018-11-03 MED ORDER — STERILE WATER FOR INJECTION IJ SOLN
INTRAMUSCULAR | Status: AC
  Filled 2018-11-03: qty 10

## 2018-11-04 ENCOUNTER — Other Ambulatory Visit: Payer: Self-pay

## 2018-11-04 ENCOUNTER — Encounter (HOSPITAL_COMMUNITY)
Admission: RE | Admit: 2018-11-04 | Discharge: 2018-11-04 | Disposition: A | Discharge status: Home / Self Care | Payer: Medicare Other | Source: Ambulatory Visit | Attending: Internal Medicine | Admitting: Internal Medicine

## 2018-11-04 MED ORDER — SODIUM IODIDE I 131 CAPSULE
200.0000 | Freq: Once | INTRAVENOUS | Status: AC | PRN
  Administered 2018-11-04: 200 via ORAL

## 2018-11-11 ENCOUNTER — Encounter (HOSPITAL_COMMUNITY)
Admission: RE | Admit: 2018-11-11 | Discharge: 2018-11-11 | Disposition: A | Discharge status: Home / Self Care | Payer: Medicare Other | Source: Ambulatory Visit | Attending: Internal Medicine | Admitting: Internal Medicine

## 2018-11-11 ENCOUNTER — Other Ambulatory Visit: Payer: Self-pay

## 2018-11-11 DIAGNOSIS — C73 Malignant neoplasm of thyroid gland: Secondary | ICD-10-CM | POA: Insufficient documentation

## 2018-11-23 ENCOUNTER — Other Ambulatory Visit: Payer: Self-pay | Admitting: Internal Medicine

## 2018-11-23 DIAGNOSIS — C73 Malignant neoplasm of thyroid gland: Secondary | ICD-10-CM

## 2018-11-23 DIAGNOSIS — C770 Secondary and unspecified malignant neoplasm of lymph nodes of head, face and neck: Secondary | ICD-10-CM

## 2018-11-23 DIAGNOSIS — C61 Malignant neoplasm of prostate: Secondary | ICD-10-CM

## 2018-11-23 DIAGNOSIS — M5136 Other intervertebral disc degeneration, lumbar region: Secondary | ICD-10-CM

## 2018-11-24 ENCOUNTER — Ambulatory Visit
Admission: RE | Admit: 2018-11-24 | Discharge: 2018-11-24 | Disposition: A | Discharge status: Home / Self Care | Payer: Medicare Other | Source: Ambulatory Visit | Attending: Internal Medicine | Admitting: Internal Medicine

## 2018-11-24 ENCOUNTER — Other Ambulatory Visit: Payer: Self-pay

## 2018-11-24 DIAGNOSIS — C770 Secondary and unspecified malignant neoplasm of lymph nodes of head, face and neck: Secondary | ICD-10-CM

## 2018-11-24 DIAGNOSIS — C61 Malignant neoplasm of prostate: Secondary | ICD-10-CM

## 2018-11-24 DIAGNOSIS — C73 Malignant neoplasm of thyroid gland: Secondary | ICD-10-CM

## 2018-11-24 DIAGNOSIS — M5136 Other intervertebral disc degeneration, lumbar region: Secondary | ICD-10-CM

## 2018-11-24 MED ORDER — IOPAMIDOL (ISOVUE-300) INJECTION 61%
100.0000 mL | Freq: Once | INTRAVENOUS | Status: AC | PRN
  Administered 2018-11-24: 100 mL via INTRAVENOUS

## 2019-01-31 ENCOUNTER — Other Ambulatory Visit: Payer: Self-pay | Admitting: Orthopedic Surgery

## 2019-02-28 NOTE — Progress Notes (Signed)
LOV [facility] 03-18-18 Epic  EKG 03-18-28 Epic CHEST XRAY 03-18-18

## 2019-02-28 NOTE — Patient Instructions (Addendum)
YOU NEED TO HAVE A COVID 19 TEST ON 03-02-19  @ 2:00 PM. THIS TEST MUST BE DONE BEFORE SURGERY, COME TO [place] ENTRANCE. ONCE YOUR COVID TEST IS COMPLETED, PLEASE BEGIN THE QUARANTINE INSTRUCTIONS AS OUTLINED IN YOUR HANDOUT.                [name]    Your procedure is scheduled on: 03-06-2019   Report to [facility] Main  Entrance    Report to admitting at 6:40 AM    Call this number if you have problems the morning of surgery [phone]    Remember:    NO SOLID FOOD AFTER MIDNIGHT THE NIGHT PRIOR TO SURGERY. NOTHING BY MOUTH EXCEPT CLEAR LIQUIDS UNTIL610 AM. PLEASE FINISH ENSURE DRINK PER SURGEON ORDER 3 HOURS PRIOR TO SCHEDULED SURGERY TIME WHICH NEEDS TO BE COMPLETED AT 610 AM.   CLEAR LIQUID DIET   Foods Allowed                                                                     Foods Excluded  Coffee and tea, regular and decaf                             liquids that you cannot  Plain Jell-O in any flavor                                             see through such as: Fruit ices (not with fruit pulp)                                     milk, soups, orange juice  Iced Popsicles                                    All solid food Carbonated beverages, regular and diet                                    Cranberry, grape and apple juices Sports drinks like Gatorade Lightly seasoned clear broth or consume(fat free) Sugar, honey syrup  Sample Menu Breakfast                                Lunch                                     Supper Cranberry juice                    Beef broth                            Chicken broth Jell-O  Grape juice                           Apple juice Coffee or tea                        Jell-O                                      Popsicle                                                Coffee or tea                        Coffee or  tea  _____________________________________________________________________     Take these medicines the morning of surgery with A SIP OF WATER: Cetirizine (Zyrtec), and Levothyroxine (Synthroid)  RUSH YOUR TEETH MORNING OF SURGERY AND RINSE YOUR MOUTH OUT, NO CHEWING GUM CANDY OR MINTS.                               You may not have any metal on your body including hair pins and              piercings     Do not wear jewelry, cologne, lotions, powders, perfumes or deodorant                          Men may shave face and neck.   Do not bring valuables to the hospital. [place].  Contacts, dentures or bridgework may not be worn into surgery.  BRING CPAP MASK AND TUBING     _____________________________________________________________________             [facility] - Preparing for Surgery Before surgery, you can play an important role.  Because skin is not sterile, your skin needs to be as free of germs as possible.  You can reduce the number of germs on your skin by washing with CHG (chlorahexidine gluconate) soap before surgery.  CHG is an antiseptic cleaner which kills germs and bonds with the skin to continue killing germs even after washing. Please DO NOT use if you have an allergy to CHG or antibacterial soaps.  If your skin becomes reddened/irritated stop using the CHG and inform your nurse when you arrive at Short Stay. Do not shave (including legs and underarms) for at least 48 hours prior to the first CHG shower.  You may shave your face/neck. Please follow these instructions carefully:  1.  Shower with CHG Soap the night before surgery and the  morning of Surgery.  2.  If you choose to wash your hair, wash your hair first as usual with your  normal  shampoo.  3.  After you shampoo, rinse your hair and body thoroughly to remove the  shampoo.                           4.  Use CHG  as you would any other liquid soap.  You can  apply chg directly  to the skin and wash                       Gently with a scrungie or clean washcloth.  5.  Apply the CHG Soap to your body ONLY FROM THE NECK DOWN.   Do not use on face/ open                           Wound or open sores. Avoid contact with eyes, ears mouth and genitals (private parts).                       Wash face,  Genitals (private parts) with your normal soap.             6.  Wash thoroughly, paying special attention to the area where your surgery  will be performed.  7.  Thoroughly rinse your body with warm water from the neck down.  8.  DO NOT shower/wash with your normal soap after using and rinsing off  the CHG Soap.                9.  Pat yourself dry with a clean towel.            10.  Wear clean pajamas.            11.  Place clean sheets on your bed the night of your first shower and do not  sleep with pets. Day of Surgery : Do not apply any lotions/deodorants the morning of surgery.  Please wear clean clothes to the hospital/surgery center.  FAILURE TO FOLLOW THESE INSTRUCTIONS MAY RESULT IN THE CANCELLATION OF YOUR SURGERY PATIENT SIGNATURE_________________________________  NURSE SIGNATURE__________________________________  ________________________________________________________________________   [name]  An incentive spirometer is a tool that can help keep your lungs clear and active. This tool measures how well you are filling your lungs with each breath. Taking long deep breaths may help reverse or decrease the chance of developing breathing (pulmonary) problems (especially infection) following:  A long period of time when you are unable to move or be active. BEFORE THE PROCEDURE   If the spirometer includes an indicator to show your best effort, your nurse or respiratory therapist will set it to a desired goal.  If possible, sit up straight or lean slightly forward. Try not to slouch.  Hold the incentive spirometer in an upright  position. INSTRUCTIONS FOR USE  1. Sit on the edge of your bed if possible, or sit up as far as you can in bed or on a chair. 2. Hold the incentive spirometer in an upright position. 3. Breathe out normally. 4. Place the mouthpiece in your mouth and seal your lips tightly around it. 5. Breathe in slowly and as deeply as possible, raising the piston or the ball toward the top of the column. 6. Hold your breath for 3-5 seconds or for as long as possible. Allow the piston or ball to fall to the bottom of the column. 7. Remove the mouthpiece from your mouth and breathe out normally. 8. Rest for a few seconds and repeat Steps 1 through 7 at least 10 times every 1-2 hours when you are awake. Take your time and take a few normal breaths between deep breaths. 9. The spirometer may include an indicator to show your best effort.  Use the indicator as a goal to work toward during each repetition. 10. After each set of 10 deep breaths, practice coughing to be sure your lungs are clear. If you have an incision (the cut made at the time of surgery), support your incision when coughing by placing a pillow or rolled up towels firmly against it. Once you are able to get out of bed, walk around indoors and cough well. You may stop using the incentive spirometer when instructed by your caregiver.  RISKS AND COMPLICATIONS  Take your time so you do not get dizzy or light-headed.  If you are in pain, you may need to take or ask for pain medication before doing incentive spirometry. It is harder to take a deep breath if you are having pain. AFTER USE  Rest and breathe slowly and easily.  It can be helpful to keep track of a log of your progress. Your caregiver can provide you with a simple table to help with this. If you are using the spirometer at home, follow these instructions: [place] IF:   You are having difficultly using the spirometer.  You have trouble using the spirometer as often as  instructed.  Your pain medication is not giving enough relief while using the spirometer.  You develop fever of 100.5 F (38.1 C) or higher. SEEK IMMEDIATE MEDICAL CARE IF:   You cough up bloody sputum that had not been present before.  You develop fever of 102 F (38.9 C) or greater.  You develop worsening pain at or near the incision site. MAKE SURE YOU:   Understand these instructions.  Will watch your condition.  Will get help right away if you are not doing well or get worse. Document Released: 12/21/2006 Document Revised: 11/02/2011 Document Reviewed: 02/21/2007 Exit[employer] Patient Information 2014 Exit[employer], [place].   ________________________________________________________________________  WHAT IS A BLOOD TRANSFUSION? Blood Transfusion Information  A transfusion is the replacement of blood or some of its parts. Blood is made up of multiple cells which provide different functions.  Red blood cells carry oxygen and are used for blood loss replacement.  White blood cells fight against infection.  Platelets control bleeding.  Plasma helps clot blood.  Other blood products are available for specialized needs, such as hemophilia or other clotting disorders. BEFORE THE TRANSFUSION  Who gives blood for transfusions?   Healthy volunteers who are fully evaluated to make sure their blood is safe. This is blood bank blood. Transfusion therapy is the safest it has ever been in the practice of medicine. Before blood is taken from a donor, a complete history is taken to make sure that person has no history of diseases nor engages in risky social behavior (examples are intravenous drug use or sexual activity with multiple partners). The donor's travel history is screened to minimize risk of transmitting infections, such as malaria. The donated blood is tested for signs of infectious diseases, such as HIV and hepatitis. The blood is then tested to be sure it is compatible with you in  order to minimize the chance of a transfusion reaction. If you or a relative donates blood, this is often done in anticipation of surgery and is not appropriate for emergency situations. It takes many days to process the donated blood. RISKS AND COMPLICATIONS Although transfusion therapy is very safe and saves many lives, the main dangers of transfusion include:   Getting an infectious disease.  Developing a transfusion reaction. This is an allergic reaction to something in the  blood you were given. Every precaution is taken to prevent this. The decision to have a blood transfusion has been considered carefully by your caregiver before blood is given. Blood is not given unless the benefits outweigh the risks. AFTER THE TRANSFUSION  Right after receiving a blood transfusion, you will usually feel much better and more energetic. This is especially true if your red blood cells have gotten low (anemic). The transfusion raises the level of the red blood cells which carry oxygen, and this usually causes an energy increase.  The nurse administering the transfusion will monitor you carefully for complications. HOME CARE INSTRUCTIONS  No special instructions are needed after a transfusion. You may find your energy is better. Speak with your caregiver about any limitations on activity for underlying diseases you may have. SEEK MEDICAL CARE IF:   Your condition is not improving after your transfusion.  You develop redness or irritation at the intravenous (IV) site. SEEK IMMEDIATE MEDICAL CARE IF:  Any of the following symptoms occur over the next 12 hours:  Shaking chills.  You have a temperature by mouth above 102 F (38.9 C), not controlled by medicine.  Chest, back, or muscle pain.  People around you feel you are not acting correctly or are confused.  Shortness of breath or difficulty breathing.  Dizziness and fainting.  You get a rash or develop hives.  You have a decrease in urine  output.  Your urine turns a dark color or changes to pink, red, or brown. Any of the following symptoms occur over the next 10 days:  You have a temperature by mouth above 102 F (38.9 C), not controlled by medicine.  Shortness of breath.  Weakness after normal activity.  The white part of the eye turns yellow (jaundice).  You have a decrease in the amount of urine or are urinating less often.  Your urine turns a dark color or changes to pink, red, or brown. Document Released: 08/07/2000 Document Revised: 11/02/2011 Document Reviewed: 03/26/2008 [facility] Patient Information 2014 [place], [place].  _______________________________________________________________________

## 2019-03-02 ENCOUNTER — Encounter (HOSPITAL_COMMUNITY): Payer: Self-pay

## 2019-03-02 ENCOUNTER — Encounter (HOSPITAL_COMMUNITY)
Admission: RE | Admit: 2019-03-02 | Discharge: 2019-03-02 | Disposition: A | Discharge status: Home / Self Care | Payer: Medicare Other | Source: Ambulatory Visit | Attending: Orthopedic Surgery | Admitting: Orthopedic Surgery

## 2019-03-02 ENCOUNTER — Other Ambulatory Visit: Payer: Self-pay

## 2019-03-02 ENCOUNTER — Other Ambulatory Visit (HOSPITAL_COMMUNITY)
Admission: RE | Admit: 2019-03-02 | Discharge: 2019-03-02 | Disposition: A | Discharge status: Home / Self Care | Payer: Medicare Other | Source: Ambulatory Visit | Attending: Orthopedic Surgery | Admitting: Orthopedic Surgery

## 2019-03-02 ENCOUNTER — Encounter (INDEPENDENT_AMBULATORY_CARE_PROVIDER_SITE_OTHER): Payer: Self-pay

## 2019-03-02 DIAGNOSIS — I10 Essential (primary) hypertension: Secondary | ICD-10-CM | POA: Insufficient documentation

## 2019-03-02 DIAGNOSIS — E890 Postprocedural hypothyroidism: Secondary | ICD-10-CM | POA: Diagnosis not present

## 2019-03-02 DIAGNOSIS — Z1159 Encounter for screening for other viral diseases: Secondary | ICD-10-CM | POA: Insufficient documentation

## 2019-03-02 DIAGNOSIS — G4733 Obstructive sleep apnea (adult) (pediatric): Secondary | ICD-10-CM | POA: Insufficient documentation

## 2019-03-02 DIAGNOSIS — Z01818 Encounter for other preprocedural examination: Secondary | ICD-10-CM | POA: Insufficient documentation

## 2019-03-02 DIAGNOSIS — Z8546 Personal history of malignant neoplasm of prostate: Secondary | ICD-10-CM | POA: Insufficient documentation

## 2019-03-02 DIAGNOSIS — Z7982 Long term (current) use of aspirin: Secondary | ICD-10-CM | POA: Diagnosis not present

## 2019-03-02 DIAGNOSIS — Z87891 Personal history of nicotine dependence: Secondary | ICD-10-CM | POA: Diagnosis not present

## 2019-03-02 DIAGNOSIS — Z79899 Other long term (current) drug therapy: Secondary | ICD-10-CM | POA: Insufficient documentation

## 2019-03-02 DIAGNOSIS — Z79890 Hormone replacement therapy (postmenopausal): Secondary | ICD-10-CM | POA: Insufficient documentation

## 2019-03-02 DIAGNOSIS — Z96652 Presence of left artificial knee joint: Secondary | ICD-10-CM | POA: Diagnosis not present

## 2019-03-02 DIAGNOSIS — Z85850 Personal history of malignant neoplasm of thyroid: Secondary | ICD-10-CM | POA: Diagnosis not present

## 2019-03-02 DIAGNOSIS — M1711 Unilateral primary osteoarthritis, right knee: Secondary | ICD-10-CM | POA: Diagnosis not present

## 2019-03-02 LAB — SURGICAL PCR SCREEN
MRSA, PCR: NEGATIVE
Staphylococcus aureus: NEGATIVE

## 2019-03-02 LAB — CBC WITH DIFFERENTIAL/PLATELET
Abs Immature Granulocytes: 0.03 10*3/uL (ref 0.00–0.07)
Basophils Absolute: 0 10*3/uL (ref 0.0–0.1)
Basophils Relative: 1 %
Eosinophils Absolute: 0.2 10*3/uL (ref 0.0–0.5)
Eosinophils Relative: 3 %
HCT: 43.9 % (ref 39.0–52.0)
Hemoglobin: 14.2 g/dL (ref 13.0–17.0)
Immature Granulocytes: 1 %
Lymphocytes Relative: 17 %
Lymphs Abs: 1.1 10*3/uL (ref 0.7–4.0)
MCH: 30.2 pg (ref 26.0–34.0)
MCHC: 32.3 g/dL (ref 30.0–36.0)
MCV: 93.4 fL (ref 80.0–100.0)
Monocytes Absolute: 1 10*3/uL (ref 0.1–1.0)
Monocytes Relative: 16 %
Neutro Abs: 4.1 10*3/uL (ref 1.7–7.7)
Neutrophils Relative %: 62 %
Platelets: 183 10*3/uL (ref 150–400)
RBC: 4.7 MIL/uL (ref 4.22–5.81)
RDW: 13.2 % (ref 11.5–15.5)
WBC: 6.5 10*3/uL (ref 4.0–10.5)
nRBC: 0 % (ref 0.0–0.2)

## 2019-03-02 LAB — BASIC METABOLIC PANEL
Anion gap: 9 (ref 5–15)
BUN: 14 mg/dL (ref 8–23)
CO2: 24 mmol/L (ref 22–32)
Calcium: 9.5 mg/dL (ref 8.9–10.3)
Chloride: 106 mmol/L (ref 98–111)
Creatinine, Ser: 0.9 mg/dL (ref 0.61–1.24)
GFR calc Af Amer: >60 mL/min (ref >60)
GFR calc non Af Amer: >60 mL/min (ref >60)
Glucose, Bld: 98 mg/dL (ref 70–99)
Potassium: 4.2 mmol/L (ref 3.5–5.1)
Sodium: 139 mmol/L (ref 135–145)

## 2019-03-02 LAB — PROTIME-INR
INR: 1.2 (ref 0.8–1.2)
Prothrombin Time: 14.6 seconds (ref 11.4–15.2)

## 2019-03-02 LAB — URINALYSIS, ROUTINE W REFLEX MICROSCOPIC
Bilirubin Urine: NEGATIVE
Glucose, UA: NEGATIVE mg/dL
Hgb urine dipstick: NEGATIVE
Ketones, ur: NEGATIVE mg/dL
Leukocytes,Ua: NEGATIVE
Nitrite: NEGATIVE
Protein, ur: NEGATIVE mg/dL
Specific Gravity, Urine: 1.012 (ref 1.005–1.030)
pH: 7 (ref 5.0–8.0)

## 2019-03-02 LAB — ABO/RH: ABO/RH(D): O POS

## 2019-03-02 LAB — APTT: aPTT: 29 seconds (ref 24–36)

## 2019-03-03 DIAGNOSIS — M1711 Unilateral primary osteoarthritis, right knee: Secondary | ICD-10-CM | POA: Diagnosis present

## 2019-03-03 LAB — SARS CORONAVIRUS 2 (TAT 6-24 HRS): SARS Coronavirus 2: NEGATIVE

## 2019-03-03 NOTE — Progress Notes (Signed)
Called patient and spoke with his wife. Time change for surgery on 03/06/2019. Surgery to start at [place]. To arrive at 0515 to admitting on 03/06/2019. To complete ensure drink by 0415 on 03/06/2019.

## 2019-03-03 NOTE — Progress Notes (Signed)
Anesthesia Chart Review   Case: [id number] Date/Time: 03/06/19 0855   Procedure: Right Knee Arthroplasty (Right )   Anesthesia type: Spinal   Pre-op diagnosis: Right Knee Osteoarthtitis   Location: [place] / [facility]   Surgeon: Frederik Pear, MD      DISCUSSION:78 y.o. former smoker (08/25/71) with h/o PONV, thyroid cancer s/p thyroidectomy 2018, HTN, OSA on CPAP, prostate cancer s/p prostatectomy 2001, right knee OA scheduled for above procedure 03/06/2019 with Dr. Gaynelle Arabian.   Pt s/p left knee replacement 03/23/18 with no anesthesia complications noted.  Prior to this procedure he was seen by cardiology for preoperative evaluation.  Seen by Buford Dresser 03/18/2018.  Per her OV note, "No history of heart disease, no active cardiac symptoms, able to do >4 METs of activity. RCRI score=0. The Revised Cardiac Risk Index indicates that he is at low risk for perioperative complications. According to ACC/AHA guidelines, no further cardiovascular testing needed.  The patient may proceed to surgery at acceptable risk.  Right bundle branch block is stable and unchanged from prior. PACs/atrial bigeminy require no additional management perioperatively."  No changes since this visit.    Anticipate pt can proceed with planned procedure barring acute status change.   VS: BP 119/84   Pulse 60   Temp 37 C (Oral)   Resp 18   Ht 5\' 10"  (1.778 m)   Wt 109.9 kg   SpO2 99%   BMI 34.77 kg/m   PROVIDERS: Wenda Low, MD is PCP   Buford Dresser, MD is Cardiologist  LABS: Labs reviewed: Acceptable for surgery. (all labs ordered are listed, but only abnormal results are displayed)  Labs Reviewed  URINALYSIS, ROUTINE W REFLEX MICROSCOPIC - Abnormal; Notable for the following components:      Result Value   APPearance CLOUDY (*)    All other components within normal limits  SURGICAL PCR SCREEN  APTT  BASIC METABOLIC PANEL  CBC WITH DIFFERENTIAL/PLATELET  PROTIME-INR  TYPE AND  SCREEN  ABO/RH     IMAGES:   EKG: 03/15/2018 Rate 62 bpm Sinus rhythm with marked sinus arrhythmia Right bundle branch block  Abnormal ECG No significant change since last tracing  CV:  Past Medical History:  Diagnosis Date  . Arthritis    "all over" (03/23/2018)  . Chronic lower back pain   . Curvature of spine   . High cholesterol   . Hypertension   . Hypothyroidism   . 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 OSA on CPAP   . PONV (postoperative nausea and vomiting)    "just w/back OR in ~ 1993"  . Prostate CA ([place]) 2001  . Thyroid cancer ([place]) 02/10/2017    Past Surgical History:  Procedure Laterality Date  . BACK SURGERY    . CARDIAC CATHETERIZATION  2002  . HEMATOMA EVACUATION N/A 02/13/2017   Procedure: EVACUATION HEMATOMA, NECK;  Surgeon: Melida Quitter, MD;  Location: [place];  Service: ENT;  Laterality: N/A;  . HERNIA REPAIR    . JOINT REPLACEMENT    . [place]   "herniated"  . PROSTATECTOMY  2001  . RADICAL NECK DISSECTION Left 02/12/2017   Procedure: RADICAL NECK DISSECTION;  Surgeon: Izora Gala, MD;  Location: [place];  Service: ENT;  Laterality: Left;  Left modified neck dissection levels 1 through 4  . THYROIDECTOMY N/A 02/12/2017   Procedure: THYROIDECTOMY;  Surgeon: Izora Gala, MD;  Location: [place];  Service: ENT;  Laterality: N/A;  total  .  TONSILLECTOMY    . TOTAL KNEE ARTHROPLASTY Left 03/23/2018  . TOTAL KNEE ARTHROPLASTY Left 03/23/2018   Procedure: TOTAL KNEE ARTHROPLASTY;  Surgeon: Frederik Pear, MD;  Location: [place];  Service: Orthopedics;  Laterality: Left;  . UMBILICAL HERNIA REPAIR      MEDICATIONS: . acetaminophen (TYLENOL) 650 MG CR tablet  . aspirin EC 81 MG tablet  . Calcium Carbonate-Vitamin D (CALCIUM 600/VITAMIN D PO)  . cetirizine (ZYRTEC) 10 MG tablet  . Cholecalciferol (VITAMIN D-3 PO)  . Coenzyme Q10 (COQ10) 100 MG CAPS  . levothyroxine (SYNTHROID, LEVOTHROID) 175 MCG tablet  . lisinopril (PRINIVIL,ZESTRIL)  20 MG tablet  . Multiple Vitamins-Minerals (ONE-A-DAY MENS 50+ ADVANTAGE) TABS  . Omega-3 Fatty Acids (FISH OIL ULTRA) 1400 MG CAPS  . oxyCODONE-acetaminophen (PERCOCET/ROXICET) 5-325 MG tablet  . simvastatin (ZOCOR) 40 MG tablet  . timolol (BETIMOL) 0.5 % ophthalmic solution  . tiZANidine (ZANAFLEX) 2 MG tablet  . Turmeric 500 MG CAPS   No current facility-administered medications for this encounter.      Maia Plan [facility] Pre-Surgical Testing [phone] 03/03/19  10:51 AM

## 2019-03-03 NOTE — H&P (Signed)
TOTAL KNEE ADMISSION H&P  Patient is being admitted for right total knee arthroplasty.  Subjective:  Chief Complaint:right knee pain.  HPI: [name], 78 y.o. male, has a history of pain and functional disability in the right knee due to arthritis and has failed non-surgical conservative treatments for greater than 12 weeks to includeNSAID's and/or analgesics, flexibility and strengthening excercises, weight reduction as appropriate and activity modification.  Onset of symptoms was gradual, starting several years ago with gradually worsening course since that time. The patient noted no past surgery on the right knee(s).  Patient currently rates pain in the right knee(s) at 10 out of 10 with activity. Patient has night pain, worsening of pain with activity and weight bearing, pain that interferes with activities of daily living, pain with passive range of motion, crepitus and joint swelling.  Patient has evidence of periarticular osteophytes, joint subluxation and joint space narrowing by imaging studies.   There is no active infection.  Patient Active Problem List   Diagnosis Date Noted  . Primary osteoarthritis of left knee 03/23/2018  . Degenerative arthritis of left knee 03/22/2018  . Right bundle branch block (RBBB) on electrocardiogram (ECG) 03/18/2018  . Hematoma of neck 02/14/2017  . Thyroid cancer ([place]) 02/12/2017   Past Medical History:  Diagnosis Date  . Arthritis    "all over" (03/23/2018)  . Chronic lower back pain   . Curvature of spine   . High cholesterol   . Hypertension   . Hypothyroidism   . 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 OSA on CPAP   . PONV (postoperative nausea and vomiting)    "just w/back OR in ~ 1993"  . Prostate CA ([place]) 2001  . Thyroid cancer ([place]) 02/10/2017    Past Surgical History:  Procedure Laterality Date  . BACK SURGERY    . CARDIAC CATHETERIZATION  2002  . HEMATOMA EVACUATION N/A 02/13/2017   Procedure: EVACUATION HEMATOMA,  NECK;  Surgeon: Melida Quitter, MD;  Location: [facility];  Service: ENT;  Laterality: N/A;  . HERNIA REPAIR    . JOINT REPLACEMENT    . [place]   "herniated"  . PROSTATECTOMY  2001  . RADICAL NECK DISSECTION Left 02/12/2017   Procedure: RADICAL NECK DISSECTION;  Surgeon: Izora Gala, MD;  Location: [facility];  Service: ENT;  Laterality: Left;  Left modified neck dissection levels 1 through 4  . THYROIDECTOMY N/A 02/12/2017   Procedure: THYROIDECTOMY;  Surgeon: Izora Gala, MD;  Location: [facility];  Service: ENT;  Laterality: N/A;  total  . TONSILLECTOMY    . TOTAL KNEE ARTHROPLASTY Left 03/23/2018  . TOTAL KNEE ARTHROPLASTY Left 03/23/2018   Procedure: TOTAL KNEE ARTHROPLASTY;  Surgeon: Frederik Pear, MD;  Location: [facility];  Service: Orthopedics;  Laterality: Left;  . UMBILICAL HERNIA REPAIR      No current facility-administered medications for this encounter.    Current Outpatient Medications  Medication Sig Dispense Refill Last Dose  . acetaminophen (TYLENOL) 650 MG CR tablet Take 1,300 mg by mouth every 8 (eight) hours as needed for pain.     Marland Kitchen aspirin EC 81 MG tablet Take 1 tablet (81 mg total) by mouth 2 (two) times daily. (Patient not taking: Reported on 03/02/2019) 60 tablet 0   . Calcium Carbonate-Vitamin D (CALCIUM 600/VITAMIN D PO) Take 1 tablet by mouth daily.     . cetirizine (ZYRTEC) 10 MG tablet Take 10 mg by mouth daily.     . Cholecalciferol (  VITAMIN D-3 PO) Take 250 mcg by mouth daily.      . Coenzyme Q10 (COQ10) 100 MG CAPS Take 100 mg by mouth daily.      Marland Kitchen levothyroxine (SYNTHROID, LEVOTHROID) 175 MCG tablet Take 175 mcg by mouth daily before breakfast.     . lisinopril (PRINIVIL,ZESTRIL) 20 MG tablet Take 20 mg by mouth daily.     . Multiple Vitamins-Minerals (ONE-A-DAY MENS 50+ ADVANTAGE) TABS Take 1 tablet by mouth daily.     . Omega-3 Fatty Acids (FISH OIL ULTRA) 1400 MG CAPS Take 1,400 mg by mouth daily.      . simvastatin (ZOCOR) 40 MG tablet Take 40 mg by  mouth daily at 6 PM.     . timolol (BETIMOL) 0.5 % ophthalmic solution Place 1 drop into both eyes daily.      . Turmeric 500 MG CAPS Take 500 mg by mouth 2 (two) times daily.      Marland Kitchen oxyCODONE-acetaminophen (PERCOCET/ROXICET) 5-325 MG tablet Take 1 tablet by mouth every 4 (four) hours as needed for severe pain. (Patient not taking: Reported on 02/24/2019) 30 tablet 0 Not Taking at Unknown time  . tiZANidine (ZANAFLEX) 2 MG tablet Take 1 tablet (2 mg total) by mouth every 6 (six) hours as needed. (Patient not taking: Reported on 02/24/2019) 60 tablet 0 Not Taking at Unknown time   Allergies  Allergen Reactions  . Codeine Other (See Comments)    Makes the patient "feel weird"    Social History   Tobacco Use  . Smoking status: Former Smoker    Years: 1.00    Types: Pipe    Quit date: 1973    Years since quitting: 47.5  . Smokeless tobacco: Never Used  Substance Use Topics  . Alcohol use: Yes    Comment: rare    Family History  Problem Relation Age of Onset  . Lymphoma Mother   . Pancreatic cancer Father      Review of Systems  Constitutional: Negative.   HENT: Negative.   Eyes: Negative.   Respiratory: Negative.   Cardiovascular: Negative.   Gastrointestinal: Negative.   Genitourinary:       Hx of prostate cancer  Musculoskeletal: Positive for joint pain and myalgias.  Skin: Negative.   Neurological: Negative.   Endo/Heme/Allergies: Negative.   Psychiatric/Behavioral: Negative.     Objective:  Physical Exam  Constitutional: He is oriented to person, place, and time. He appears well-developed and well-nourished.  HENT:  Head: Normocephalic and atraumatic.  Eyes: Pupils are equal, round, and reactive to light.  Neck: Normal range of motion. Neck supple.  Cardiovascular: Intact distal pulses.  Respiratory: Effort normal.  Musculoskeletal:        General: Tenderness present.     Comments: On the right side he has a 5 varus deformity 5 flexion contracture flexes 120  with pain good quadriceps and hamstring power 1+ effusion collateral ligaments are stable toes are pink and well perfused neurovascular intact distally.  Neurological: He is alert and oriented to person, place, and time.  Skin: Skin is warm and dry.  Psychiatric: He has a normal mood and affect. His behavior is normal. Judgment and thought content normal.    Vital signs in last 24 hours:    Labs:   Estimated body mass index is 34.77 kg/m as calculated from the following:   Height as of 03/02/19: 5\' 10"  (1.778 m).   Weight as of 03/02/19: 109.9 kg.   Imaging Review Plain radiographs demonstrate  AP lateral and sunrise x-rays show well-placed well fixed Depuy attune total knee on the left and end-stage arthritis of the right knee again bone-on-bone varus configuration 5 mm lateral subluxation of tibia beneath femur.   Assessment/Plan:  End stage arthritis, right knee   The patient history, physical examination, clinical judgment of the provider and imaging studies are consistent with end stage degenerative joint disease of the right knee(s) and total knee arthroplasty is deemed medically necessary. The treatment options including medical management, injection therapy arthroscopy and arthroplasty were discussed at length. The risks and benefits of total knee arthroplasty were presented and reviewed. The risks due to aseptic loosening, infection, stiffness, patella tracking problems, thromboembolic complications and other imponderables were discussed. The patient acknowledged the explanation, agreed to proceed with the plan and consent was signed. Patient is being admitted for inpatient treatment for surgery, pain control, PT, OT, prophylactic antibiotics, VTE prophylaxis, progressive ambulation and ADL's and discharge planning. The patient is planning to be discharged home with home health services     Patient's anticipated LOS is less than 2 midnights, meeting these requirements: - Younger  than 61 - Lives within 1 hour of care - Has a competent adult at home to recover with post-op recover - NO history of  - Chronic pain requiring opiods  - Diabetes  - Coronary Artery Disease  - Heart failure  - Heart attack  - Stroke  - DVT/VTE  - Cardiac arrhythmia  - Respiratory Failure/COPD  - Renal failure  - Anemia  - Advanced Liver disease

## 2019-03-03 NOTE — Anesthesia Preprocedure Evaluation (Addendum)
Anesthesia Evaluation  Patient identified by MRN, date of birth, ID band Patient awake    Reviewed: Allergy & Precautions, NPO status , Patient's Chart, lab work & pertinent test results  History of Anesthesia Complications (+) PONV and history of anesthetic complications  Airway Mallampati: II  TM Distance: >3 FB Neck ROM: Full    Dental  (+) Dental Advisory Given   Pulmonary sleep apnea and Continuous Positive Airway Pressure Ventilation , former smoker,    breath sounds clear to auscultation       Cardiovascular hypertension, Pt. on medications  Rhythm:Regular Rate:Normal     Neuro/Psych negative neurological ROS  negative psych ROS   GI/Hepatic negative GI ROS, Neg liver ROS,   Endo/Other  Hypothyroidism  Obesity   Renal/GU  Renal cysts     Prostate cancer     Musculoskeletal  (+) Arthritis ,   Abdominal   Peds  Hematology negative hematology ROS (+)   Anesthesia Other Findings   Reproductive/Obstetrics                           Anesthesia Physical Anesthesia Plan  ASA: III  Anesthesia Plan: Spinal   Post-op Pain Management:  Regional for Post-op pain   Induction:   PONV Risk Score and Plan: 2 and Treatment may vary due to age or medical condition and Propofol infusion  Airway Management Planned: Natural Airway and Simple Face Mask  Additional Equipment: None  Intra-op Plan:   Post-operative Plan:   Informed Consent: I have reviewed the patients History and Physical, chart, labs and discussed the procedure including the risks, benefits and alternatives for the proposed anesthesia with the patient or authorized representative who has indicated his/her understanding and acceptance.       Plan Discussed with: CRNA and Anesthesiologist  Anesthesia Plan Comments: (Labs reviewed, platelets acceptable. Discussed risks and benefits of spinal, including  spinal/epidural hematoma, infection, failed block, and PDPH. Patient expressed understanding and wished to proceed. )      Anesthesia Quick Evaluation

## 2019-03-05 MED ORDER — TRANEXAMIC ACID 1000 MG/10ML IV SOLN
2000.0000 mg | INTRAVENOUS | Status: DC
  Filled 2019-03-05: qty 20

## 2019-03-05 MED ORDER — BUPIVACAINE LIPOSOME 1.3 % IJ SUSP
20.0000 mL | INTRAMUSCULAR | Status: DC
  Filled 2019-03-05: qty 20

## 2019-03-06 ENCOUNTER — Observation Stay (HOSPITAL_COMMUNITY)
Admission: RE | Admit: 2019-03-06 | Discharge: 2019-03-07 | Disposition: A | Discharge status: Home / Self Care | Payer: Medicare Other | Attending: Orthopedic Surgery | Admitting: Orthopedic Surgery

## 2019-03-06 ENCOUNTER — Ambulatory Visit (HOSPITAL_COMMUNITY): Payer: Medicare Other | Admitting: Physician Assistant

## 2019-03-06 ENCOUNTER — Ambulatory Visit (HOSPITAL_COMMUNITY): Payer: Medicare Other | Admitting: Anesthesiology

## 2019-03-06 ENCOUNTER — Encounter (HOSPITAL_COMMUNITY)
Admission: RE | Disposition: A | Discharge status: Home / Self Care | Payer: Self-pay | Source: Home / Self Care | Attending: Orthopedic Surgery

## 2019-03-06 ENCOUNTER — Other Ambulatory Visit: Payer: Self-pay

## 2019-03-06 ENCOUNTER — Encounter (HOSPITAL_COMMUNITY): Payer: Self-pay | Admitting: *Deleted

## 2019-03-06 DIAGNOSIS — I4510 Unspecified right bundle-branch block: Secondary | ICD-10-CM | POA: Insufficient documentation

## 2019-03-06 DIAGNOSIS — Z85850 Personal history of malignant neoplasm of thyroid: Secondary | ICD-10-CM | POA: Diagnosis not present

## 2019-03-06 DIAGNOSIS — Z96652 Presence of left artificial knee joint: Secondary | ICD-10-CM | POA: Diagnosis not present

## 2019-03-06 DIAGNOSIS — M1711 Unilateral primary osteoarthritis, right knee: Secondary | ICD-10-CM | POA: Diagnosis present

## 2019-03-06 DIAGNOSIS — G8929 Other chronic pain: Secondary | ICD-10-CM | POA: Insufficient documentation

## 2019-03-06 DIAGNOSIS — Z87891 Personal history of nicotine dependence: Secondary | ICD-10-CM | POA: Diagnosis not present

## 2019-03-06 DIAGNOSIS — Z7982 Long term (current) use of aspirin: Secondary | ICD-10-CM | POA: Diagnosis not present

## 2019-03-06 DIAGNOSIS — Z8546 Personal history of malignant neoplasm of prostate: Secondary | ICD-10-CM | POA: Diagnosis not present

## 2019-03-06 DIAGNOSIS — Z96641 Presence of right artificial hip joint: Secondary | ICD-10-CM

## 2019-03-06 DIAGNOSIS — G4733 Obstructive sleep apnea (adult) (pediatric): Secondary | ICD-10-CM | POA: Insufficient documentation

## 2019-03-06 DIAGNOSIS — Z79899 Other long term (current) drug therapy: Secondary | ICD-10-CM | POA: Insufficient documentation

## 2019-03-06 DIAGNOSIS — N281 Cyst of kidney, acquired: Secondary | ICD-10-CM | POA: Diagnosis not present

## 2019-03-06 DIAGNOSIS — E669 Obesity, unspecified: Secondary | ICD-10-CM | POA: Insufficient documentation

## 2019-03-06 DIAGNOSIS — Z6834 Body mass index (BMI) 34.0-34.9, adult: Secondary | ICD-10-CM | POA: Diagnosis not present

## 2019-03-06 DIAGNOSIS — M545 Low back pain: Secondary | ICD-10-CM | POA: Insufficient documentation

## 2019-03-06 DIAGNOSIS — E890 Postprocedural hypothyroidism: Secondary | ICD-10-CM | POA: Diagnosis not present

## 2019-03-06 DIAGNOSIS — E7800 Pure hypercholesterolemia, unspecified: Secondary | ICD-10-CM | POA: Insufficient documentation

## 2019-03-06 DIAGNOSIS — I10 Essential (primary) hypertension: Secondary | ICD-10-CM | POA: Diagnosis not present

## 2019-03-06 HISTORY — PX: TOTAL KNEE ARTHROPLASTY: SHX125

## 2019-03-06 LAB — TYPE AND SCREEN
ABO/RH(D): O POS
Antibody Screen: NEGATIVE

## 2019-03-06 SURGERY — ARTHROPLASTY, KNEE, TOTAL
Anesthesia: Spinal | Laterality: Right

## 2019-03-06 MED ORDER — OXYCODONE HCL 5 MG PO TABS: 5.0000 mg | ORAL_TABLET | Freq: Once | ORAL | Status: DC | PRN

## 2019-03-06 MED ORDER — KCL IN DEXTROSE-NACL 20-5-0.45 MEQ/L-%-% IV SOLN
INTRAVENOUS | Status: DC
  Administered 2019-03-06 (×2): via INTRAVENOUS
  Filled 2019-03-06 (×3): qty 1000

## 2019-03-06 MED ORDER — FLEET ENEMA 7-19 GM/118ML RE ENEM: 1.0000 | ENEMA | Freq: Once | RECTAL | Status: DC | PRN

## 2019-03-06 MED ORDER — FENTANYL CITRATE (PF) 100 MCG/2ML IJ SOLN: 25.0000 ug | INTRAMUSCULAR | Status: DC | PRN

## 2019-03-06 MED ORDER — WATER FOR IRRIGATION, STERILE IR SOLN
Status: DC | PRN
  Administered 2019-03-06: 2000 mL

## 2019-03-06 MED ORDER — CELECOXIB 200 MG PO CAPS
200.0000 mg | ORAL_CAPSULE | Freq: Two times a day (BID) | ORAL | Status: DC
  Administered 2019-03-06 – 2019-03-07 (×2): 200 mg via ORAL
  Filled 2019-03-06 (×2): qty 1

## 2019-03-06 MED ORDER — ADULT MULTIVITAMIN W/MINERALS CH
1.0000 | ORAL_TABLET | Freq: Every day | ORAL | Status: DC
  Administered 2019-03-06 – 2019-03-07 (×2): 1 via ORAL
  Filled 2019-03-06 (×2): qty 1

## 2019-03-06 MED ORDER — SODIUM CHLORIDE (PF) 0.9 % IJ SOLN
INTRAMUSCULAR | Status: DC | PRN
  Administered 2019-03-06: 70 mL

## 2019-03-06 MED ORDER — TIZANIDINE HCL 2 MG PO TABS: 2.0000 mg | ORAL_TABLET | Freq: Four times a day (QID) | ORAL | 0 refills | Status: AC | PRN

## 2019-03-06 MED ORDER — DEXAMETHASONE SODIUM PHOSPHATE 10 MG/ML IJ SOLN
INTRAMUSCULAR | Status: AC
  Filled 2019-03-06: qty 1

## 2019-03-06 MED ORDER — BUPIVACAINE-EPINEPHRINE (PF) 0.25% -1:200000 IJ SOLN
INTRAMUSCULAR | Status: DC | PRN
  Administered 2019-03-06: 30 mL

## 2019-03-06 MED ORDER — FENTANYL CITRATE (PF) 100 MCG/2ML IJ SOLN
INTRAMUSCULAR | Status: DC | PRN
  Administered 2019-03-06: 50 ug via INTRAVENOUS

## 2019-03-06 MED ORDER — OXYCODONE-ACETAMINOPHEN 5-325 MG PO TABS: 1.0000 | ORAL_TABLET | ORAL | 0 refills | Status: AC | PRN

## 2019-03-06 MED ORDER — POVIDONE-IODINE 10 % EX SWAB
2.0000 "application " | Freq: Once | CUTANEOUS | Status: AC
  Administered 2019-03-06: 2 via TOPICAL

## 2019-03-06 MED ORDER — DIPHENHYDRAMINE HCL 12.5 MG/5ML PO ELIX: 12.5000 mg | ORAL_SOLUTION | ORAL | Status: DC | PRN

## 2019-03-06 MED ORDER — TRANEXAMIC ACID-NACL 1000-0.7 MG/100ML-% IV SOLN
1000.0000 mg | Freq: Once | INTRAVENOUS | Status: AC
  Administered 2019-03-06: 1000 mg via INTRAVENOUS
  Filled 2019-03-06: qty 100

## 2019-03-06 MED ORDER — EPHEDRINE SULFATE-NACL 50-0.9 MG/10ML-% IV SOSY
PREFILLED_SYRINGE | INTRAVENOUS | Status: DC | PRN
  Administered 2019-03-06 (×3): 5 mg via INTRAVENOUS

## 2019-03-06 MED ORDER — LORATADINE 10 MG PO TABS
10.0000 mg | ORAL_TABLET | Freq: Every day | ORAL | Status: DC
  Administered 2019-03-06 – 2019-03-07 (×2): 10 mg via ORAL
  Filled 2019-03-06 (×2): qty 1

## 2019-03-06 MED ORDER — BUPIVACAINE LIPOSOME 1.3 % IJ SUSP
INTRAMUSCULAR | Status: DC | PRN
  Administered 2019-03-06: 20 mL

## 2019-03-06 MED ORDER — CHLORHEXIDINE GLUCONATE 4 % EX LIQD: 60.0000 mL | Freq: Once | CUTANEOUS | Status: DC

## 2019-03-06 MED ORDER — ACETAMINOPHEN 325 MG PO TABS: 325.0000 mg | ORAL_TABLET | Freq: Four times a day (QID) | ORAL | Status: DC | PRN

## 2019-03-06 MED ORDER — TIMOLOL HEMIHYDRATE 0.5 % OP SOLN
1.0000 [drp] | Freq: Every day | OPHTHALMIC | Status: DC
  Filled 2019-03-06: qty 5

## 2019-03-06 MED ORDER — ROPIVACAINE HCL 7.5 MG/ML IJ SOLN
INTRAMUSCULAR | Status: DC | PRN
  Administered 2019-03-06: 20 mL via PERINEURAL

## 2019-03-06 MED ORDER — FENTANYL CITRATE (PF) 100 MCG/2ML IJ SOLN
INTRAMUSCULAR | Status: AC
  Filled 2019-03-06: qty 2

## 2019-03-06 MED ORDER — ONDANSETRON HCL 4 MG/2ML IJ SOLN
INTRAMUSCULAR | Status: AC
  Filled 2019-03-06: qty 2

## 2019-03-06 MED ORDER — LIDOCAINE 2% (20 MG/ML) 5 ML SYRINGE
INTRAMUSCULAR | Status: AC
  Filled 2019-03-06: qty 5

## 2019-03-06 MED ORDER — METOCLOPRAMIDE HCL 5 MG PO TABS: 5.0000 mg | ORAL_TABLET | Freq: Three times a day (TID) | ORAL | Status: DC | PRN

## 2019-03-06 MED ORDER — PHENOL 1.4 % MT LIQD: 1.0000 | OROMUCOSAL | Status: DC | PRN

## 2019-03-06 MED ORDER — PHENYLEPHRINE HCL (PRESSORS) 10 MG/ML IV SOLN
INTRAVENOUS | Status: AC
  Filled 2019-03-06: qty 1

## 2019-03-06 MED ORDER — PROPOFOL 10 MG/ML IV BOLUS
INTRAVENOUS | Status: AC
  Filled 2019-03-06: qty 60

## 2019-03-06 MED ORDER — SIMVASTATIN 40 MG PO TABS
40.0000 mg | ORAL_TABLET | Freq: Every day | ORAL | Status: DC
  Administered 2019-03-06: 40 mg via ORAL
  Filled 2019-03-06: qty 1

## 2019-03-06 MED ORDER — 0.9 % SODIUM CHLORIDE (POUR BTL) OPTIME
TOPICAL | Status: DC | PRN
  Administered 2019-03-06: 1000 mL

## 2019-03-06 MED ORDER — SODIUM CHLORIDE (PF) 0.9 % IJ SOLN
INTRAMUSCULAR | Status: AC
  Filled 2019-03-06: qty 20

## 2019-03-06 MED ORDER — LACTATED RINGERS IV SOLN
INTRAVENOUS | Status: DC
  Administered 2019-03-06: 06:00:00 via INTRAVENOUS

## 2019-03-06 MED ORDER — EPHEDRINE 5 MG/ML INJ
INTRAVENOUS | Status: AC
  Filled 2019-03-06: qty 10

## 2019-03-06 MED ORDER — SODIUM CHLORIDE (PF) 0.9 % IJ SOLN
INTRAMUSCULAR | Status: AC
  Filled 2019-03-06: qty 50

## 2019-03-06 MED ORDER — POLYETHYLENE GLYCOL 3350 17 G PO PACK: 17.0000 g | PACK | Freq: Every day | ORAL | Status: DC | PRN

## 2019-03-06 MED ORDER — ONDANSETRON HCL 4 MG PO TABS: 4.0000 mg | ORAL_TABLET | Freq: Four times a day (QID) | ORAL | Status: DC | PRN

## 2019-03-06 MED ORDER — OXYCODONE HCL 5 MG PO TABS
5.0000 mg | ORAL_TABLET | ORAL | Status: DC | PRN
  Administered 2019-03-06: 5 mg via ORAL
  Filled 2019-03-06: qty 1

## 2019-03-06 MED ORDER — METHOCARBAMOL 500 MG IVPB - SIMPLE MED
500.0000 mg | Freq: Four times a day (QID) | INTRAVENOUS | Status: DC | PRN
  Filled 2019-03-06: qty 50

## 2019-03-06 MED ORDER — ALUM & MAG HYDROXIDE-SIMETH 200-200-20 MG/5ML PO SUSP: 30.0000 mL | ORAL | Status: DC | PRN

## 2019-03-06 MED ORDER — TRANEXAMIC ACID 1000 MG/10ML IV SOLN
INTRAVENOUS | Status: DC | PRN
  Administered 2019-03-06: 2000 mg via TOPICAL

## 2019-03-06 MED ORDER — LISINOPRIL 20 MG PO TABS
20.0000 mg | ORAL_TABLET | Freq: Every day | ORAL | Status: DC
  Filled 2019-03-06: qty 1

## 2019-03-06 MED ORDER — PANTOPRAZOLE SODIUM 40 MG PO TBEC
40.0000 mg | DELAYED_RELEASE_TABLET | Freq: Every day | ORAL | Status: DC
  Administered 2019-03-06 – 2019-03-07 (×2): 40 mg via ORAL
  Filled 2019-03-06 (×2): qty 1

## 2019-03-06 MED ORDER — BISACODYL 5 MG PO TBEC: 5.0000 mg | DELAYED_RELEASE_TABLET | Freq: Every day | ORAL | Status: DC | PRN

## 2019-03-06 MED ORDER — ONDANSETRON HCL 4 MG/2ML IJ SOLN
INTRAMUSCULAR | Status: DC | PRN
  Administered 2019-03-06: 4 mg via INTRAVENOUS

## 2019-03-06 MED ORDER — MIDAZOLAM HCL 2 MG/2ML IJ SOLN
INTRAMUSCULAR | Status: AC
  Filled 2019-03-06: qty 2

## 2019-03-06 MED ORDER — PROPOFOL 10 MG/ML IV BOLUS
INTRAVENOUS | Status: AC
  Filled 2019-03-06: qty 20

## 2019-03-06 MED ORDER — ASPIRIN EC 81 MG PO TBEC: 81.0000 mg | DELAYED_RELEASE_TABLET | Freq: Two times a day (BID) | ORAL | 0 refills | Status: AC

## 2019-03-06 MED ORDER — DEXAMETHASONE SODIUM PHOSPHATE 10 MG/ML IJ SOLN
INTRAMUSCULAR | Status: DC | PRN
  Administered 2019-03-06: 8 mg via INTRAVENOUS

## 2019-03-06 MED ORDER — CEFAZOLIN SODIUM-DEXTROSE 2-4 GM/100ML-% IV SOLN
2.0000 g | INTRAVENOUS | Status: AC
  Administered 2019-03-06: 2 g via INTRAVENOUS
  Filled 2019-03-06: qty 100

## 2019-03-06 MED ORDER — GABAPENTIN 300 MG PO CAPS
300.0000 mg | ORAL_CAPSULE | Freq: Three times a day (TID) | ORAL | Status: DC
  Administered 2019-03-06 – 2019-03-07 (×3): 300 mg via ORAL
  Filled 2019-03-06 (×3): qty 1

## 2019-03-06 MED ORDER — OXYCODONE HCL 5 MG/5ML PO SOLN: 5.0000 mg | Freq: Once | ORAL | Status: DC | PRN

## 2019-03-06 MED ORDER — LEVOTHYROXINE SODIUM 50 MCG PO TABS
175.0000 ug | ORAL_TABLET | Freq: Every day | ORAL | Status: DC
  Administered 2019-03-07: 175 ug via ORAL
  Filled 2019-03-06: qty 1

## 2019-03-06 MED ORDER — HYDROMORPHONE HCL 1 MG/ML IJ SOLN: 0.5000 mg | INTRAMUSCULAR | Status: DC | PRN

## 2019-03-06 MED ORDER — ONDANSETRON HCL 4 MG/2ML IJ SOLN: 4.0000 mg | Freq: Four times a day (QID) | INTRAMUSCULAR | Status: DC | PRN

## 2019-03-06 MED ORDER — METOCLOPRAMIDE HCL 5 MG/ML IJ SOLN: 5.0000 mg | Freq: Three times a day (TID) | INTRAMUSCULAR | Status: DC | PRN

## 2019-03-06 MED ORDER — PROPOFOL 500 MG/50ML IV EMUL
INTRAVENOUS | Status: DC | PRN
  Administered 2019-03-06: 75 ug/kg/min via INTRAVENOUS

## 2019-03-06 MED ORDER — MENTHOL 3 MG MT LOZG: 1.0000 | LOZENGE | OROMUCOSAL | Status: DC | PRN

## 2019-03-06 MED ORDER — ASPIRIN 81 MG PO CHEW
81.0000 mg | CHEWABLE_TABLET | Freq: Two times a day (BID) | ORAL | Status: DC
  Administered 2019-03-06 – 2019-03-07 (×2): 81 mg via ORAL
  Filled 2019-03-06 (×2): qty 1

## 2019-03-06 MED ORDER — BUPIVACAINE-EPINEPHRINE (PF) 0.25% -1:200000 IJ SOLN
INTRAMUSCULAR | Status: AC
  Filled 2019-03-06: qty 30

## 2019-03-06 MED ORDER — DOCUSATE SODIUM 100 MG PO CAPS
100.0000 mg | ORAL_CAPSULE | Freq: Two times a day (BID) | ORAL | Status: DC
  Administered 2019-03-06 – 2019-03-07 (×3): 100 mg via ORAL
  Filled 2019-03-06 (×3): qty 1

## 2019-03-06 MED ORDER — METHOCARBAMOL 500 MG PO TABS: 500.0000 mg | ORAL_TABLET | Freq: Four times a day (QID) | ORAL | Status: DC | PRN

## 2019-03-06 MED ORDER — TRANEXAMIC ACID-NACL 1000-0.7 MG/100ML-% IV SOLN
1000.0000 mg | INTRAVENOUS | Status: AC
  Administered 2019-03-06: 08:00:00 1000 mg via INTRAVENOUS
  Filled 2019-03-06: qty 100

## 2019-03-06 MED ORDER — ONDANSETRON HCL 4 MG/2ML IJ SOLN: 4.0000 mg | Freq: Once | INTRAMUSCULAR | Status: DC | PRN

## 2019-03-06 MED ORDER — SODIUM CHLORIDE 0.9 % IR SOLN
Status: DC | PRN
  Administered 2019-03-06: 1

## 2019-03-06 MED ORDER — MIDAZOLAM HCL 2 MG/2ML IJ SOLN
INTRAMUSCULAR | Status: DC | PRN
  Administered 2019-03-06 (×2): 1 mg via INTRAVENOUS

## 2019-03-06 SURGICAL SUPPLY — 59 items
ATTUNE MED DOME PAT 41 KNEE (Knees) ×1 IMPLANT
ATTUNE MED DOME PAT 41MM KNEE (Knees) ×1 IMPLANT
ATTUNE PS FEM RT SZ9 CEM KNEE (Femur) ×2 IMPLANT
BAG DECANTER FOR FLEXI CONT (MISCELLANEOUS) ×3 IMPLANT
BAG SPEC THK2 15X12 ZIP CLS (MISCELLANEOUS) ×1
BAG ZIPLOCK 12X15 (MISCELLANEOUS) ×3 IMPLANT
BASE TIBIAL ROT PLAT SZ 10 KNE (Miscellaneous) IMPLANT
BLADE SAG 18X100X1.27 (BLADE) ×3 IMPLANT
BLADE SAW SGTL 11.0X1.19X90.0M (BLADE) ×3 IMPLANT
BLADE SURG SZ10 CARB STEEL (BLADE) ×6 IMPLANT
BNDG CMPR MED 10X6 ELC LF (GAUZE/BANDAGES/DRESSINGS) ×1
BNDG CMPR MED 15X6 ELC VLCR LF (GAUZE/BANDAGES/DRESSINGS) ×1
BNDG ELASTIC 6X10 VLCR STRL LF (GAUZE/BANDAGES/DRESSINGS) ×3 IMPLANT
BNDG ELASTIC 6X15 VLCR STRL LF (GAUZE/BANDAGES/DRESSINGS) ×2 IMPLANT
BOWL SMART MIX CTS (DISPOSABLE) ×3 IMPLANT
BSPLAT TIB 10 CMNT ROT PLAT (Miscellaneous) ×1 IMPLANT
CEMENT HV SMART SET (Cement) ×6 IMPLANT
COVER SURGICAL LIGHT HANDLE (MISCELLANEOUS) ×3 IMPLANT
COVER WAND RF STERILE (DRAPES) IMPLANT
CUFF TOURN SGL QUICK 34 (TOURNIQUET CUFF) ×3
CUFF TRNQT CYL 34X4.125X (TOURNIQUET CUFF) ×1 IMPLANT
DECANTER SPIKE VIAL GLASS SM (MISCELLANEOUS) ×9 IMPLANT
DRAPE U-SHAPE 47X51 STRL (DRAPES) ×3 IMPLANT
DRSG AQUACEL AG ADV 3.5X10 (GAUZE/BANDAGES/DRESSINGS) ×3 IMPLANT
DURAPREP 26ML APPLICATOR (WOUND CARE) ×3 IMPLANT
ELECT REM PT RETURN 15FT ADLT (MISCELLANEOUS) ×3 IMPLANT
GLOVE BIO SURGEON STRL SZ7.5 (GLOVE) ×3 IMPLANT
GLOVE BIO SURGEON STRL SZ8.5 (GLOVE) ×3 IMPLANT
GLOVE BIOGEL PI IND STRL 8 (GLOVE) ×1 IMPLANT
GLOVE BIOGEL PI IND STRL 9 (GLOVE) ×1 IMPLANT
GLOVE BIOGEL PI INDICATOR 8 (GLOVE) ×2
GLOVE BIOGEL PI INDICATOR 9 (GLOVE) ×2
GOWN STRL REUS W/TWL XL LVL3 (GOWN DISPOSABLE) ×6 IMPLANT
HANDPIECE INTERPULSE COAX TIP (DISPOSABLE) ×3
HOOD PEEL AWAY FLYTE STAYCOOL (MISCELLANEOUS) ×9 IMPLANT
INSERT TIBIAL ATTUNE SZ9 5MM (Insert) ×2 IMPLANT
KIT TURNOVER KIT A (KITS) IMPLANT
NDL HYPO 21X1.5 SAFETY (NEEDLE) ×2 IMPLANT
NEEDLE HYPO 21X1.5 SAFETY (NEEDLE) ×6 IMPLANT
NS IRRIG 1000ML POUR BTL (IV SOLUTION) ×3 IMPLANT
PACK ICE MAXI GEL EZY WRAP (MISCELLANEOUS) ×3 IMPLANT
PACK TOTAL KNEE CUSTOM (KITS) ×3 IMPLANT
PIN STEINMAN FIXATION KNEE (PIN) ×2 IMPLANT
PIN THREADED HEADED SIGMA (PIN) ×2 IMPLANT
PROTECTOR NERVE ULNAR (MISCELLANEOUS) ×3 IMPLANT
SET HNDPC FAN SPRY TIP SCT (DISPOSABLE) ×1 IMPLANT
STAPLER VISISTAT 35W (STAPLE) IMPLANT
SUT VIC AB 1 CTX 36 (SUTURE) ×3
SUT VIC AB 1 CTX36XBRD ANBCTR (SUTURE) ×1 IMPLANT
SUT VIC AB 2-0 CT1 27 (SUTURE) ×3
SUT VIC AB 2-0 CT1 TAPERPNT 27 (SUTURE) ×1 IMPLANT
SUT VIC AB 3-0 CT1 27 (SUTURE) ×3
SUT VIC AB 3-0 CT1 TAPERPNT 27 (SUTURE) ×1 IMPLANT
SYR CONTROL 10ML LL (SYRINGE) ×6 IMPLANT
TIBIAL BASE ROT PLAT SZ 10 KNE (Miscellaneous) ×3 IMPLANT
TRAY FOLEY MTR SLVR 16FR STAT (SET/KITS/TRAYS/PACK) ×3 IMPLANT
WATER STERILE IRR 1000ML POUR (IV SOLUTION) ×6 IMPLANT
WRAP KNEE MAXI GEL POST OP (GAUZE/BANDAGES/DRESSINGS) ×2 IMPLANT
YANKAUER SUCT BULB TIP 10FT TU (MISCELLANEOUS) ×3 IMPLANT

## 2019-03-06 NOTE — Op Note (Signed)
PATIENT ID:      [name]  MRN:     [id number] DOB/AGE:    March 17, 1941 / 78 y.o.       OPERATIVE REPORT    DATE OF PROCEDURE:  03/06/2019       PREOPERATIVE DIAGNOSIS:   Right Knee Osteoarthtitis      Estimated body mass index is 34.77 kg/m as calculated from the following:   Height as of 03/02/19: 5\' 10"  (1.778 m).   Weight as of 03/02/19: 109.9 kg.                                                        POSTOPERATIVE DIAGNOSIS:   Right Knee Osteoarthtitis                                                                      PROCEDURE:  Procedure(s): Right Knee Arthroplasty Using DepuyAttune RP implants #9R Femur, #10Tibia, 5 mm Attune RP bearing, 41 Patella     SURGEON: Kerin Salen    ASSISTANT:   Kerry Hough. [employer]   (Present and scrubbed throughout the case, critical for assistance with exposure, retraction, instrumentation, and closure.)         ANESTHESIA: Spinal, 20cc Exparel, 50cc 0.25% Marcaine  EBL: 350 cc  FLUID REPLACEMENT: 1600 cc crystaloid  Tourniquet Time: 36min  Drains: None  Tranexamic Acid: 1gm IV, 2gm topical  Exparel: 266mg    COMPLICATIONS:  None         INDICATIONS FOR PROCEDURE: The patient has  Right Knee Osteoarthtitis, Var deformities, XR shows bone on bone arthritis, lateral subluxation of tibia. Patient has failed all conservative measures including anti-inflammatory medicines, narcotics, attempts at exercise and weight loss, cortisone injections and viscosupplementation.  Risks and benefits of surgery have been discussed, questions answered.   DESCRIPTION OF PROCEDURE: The patient identified by armband, received  IV antibiotics, in the holding area at [facility]. Patient taken to the operating room, appropriate anesthetic monitors were attached, and Spinal anesthesia was  induced. IV Tranexamic acid was given.Tourniquet applied high to the operative thigh. Lateral post and foot positioner applied to the table, the lower extremity was then  prepped and draped in usual sterile fashion from the toes to the tourniquet. Time-out procedure was performed. The skin and subcutaneous tissue along the incision was injected with 20 cc of a mixture of Exparel and Marcaine solution, using a 20-gauge by 1-1/2 inch needle. We began the operation, with the knee flexed 130 degrees, by making the anterior midline incision starting at handbreadth above the patella going over the patella 1 cm medial to and 4 cm distal to the tibial tubercle. Small bleeders in the skin and the subcutaneous tissue identified and cauterized. Transverse retinaculum was incised and reflected medially and a medial parapatellar arthrotomy was accomplished. the patella was everted and theprepatellar fat pad resected. The superficial medial collateral ligament was then elevated from anterior to posterior along the proximal flare of the tibia and anterior half of the menisci resected. The knee was hyperflexed exposing bone on bone  arthritis. Peripheral and notch osteophytes as well as the cruciate ligaments were then resected. We continued to work our way around posteriorly along the proximal tibia, and externally rotated the tibia subluxing it out from underneath the femur. A McHale retractor was placed through the notch and a lateral Hohmann retractor placed, and we then drilled through the proximal tibia in line with the axis of the tibia followed by an intramedullary guide rod and 2-degree posterior slope cutting guide. The tibial cutting guide, 4 degree posterior sloped, was pinned into place allowing resection of 6 mm of bone medially and 12 mm of bone laterally. Satisfied with the tibial resection, we then entered the distal femur 2 mm anterior to the PCL origin with the intramedullary guide rod and applied the distal femoral cutting guide set at 9 mm, with 5 degrees of valgus. This was pinned along the epicondylar axis. At this point, the distal femoral cut was accomplished without  difficulty. We then sized for a #9R femoral component and pinned the guide in 3 degrees of external rotation. The chamfer cutting guide was pinned into place. The anterior, posterior, and chamfer cuts were accomplished without difficulty followed by the Attune RP box cutting guide and the box cut. We also removed posterior osteophytes from the posterior femoral condyles. The posterior capsule was injected with Exparel solution. The knee was brought into full extension. We checked our extension gap and fit a 5 mm bearing. Distracting in extension with a lamina spreader,  bleeders in the posterior capsule, Posterior medial and posterior lateral right down and cauterized.  The transexamic acid-soaked sponge was then placed in the gap of the knee and extension. The knee was flexed 30. The posterior patella cut was accomplished with the 9.5 mm Attune cutting guide, sized for a 75mm dome, and the fixation pegs drilled.The knee was then once again hyperflexed exposing the proximal tibia. We sized for a # 10 tibial base plate, applied the smokestack and the conical reamer followed by the the Delta fin keel punch. We then hammered into place the Attune RP trial femoral component, drilled the lugs, inserted a  5 mm trial bearing, trial patellar button, and took the knee through range of motion from 0-130 degrees. Medial and lateral ligamentous stability was checked. No thumb pressure was required for patellar Tracking. The tourniquet was inflated to 35mmHg. All trial components were removed, mating surfaces irrigated with pulse lavage, and dried with suction and sponges. 10 cc of the Exparel solution was applied to the cancellus bone of the patella distal femur and proximal tibia.  After waiting 30 seconds, the bony surfaces were again, dried with sponges. A double batch of DePuy HV cement was mixed and applied to all bony metallic mating surfaces except for the posterior condyles of the femur itself. In order, we hammered  into place the tibial tray and removed excess cement, the femoral component and removed excess cement. The final Attune RP bearing was inserted, and the knee brought to full extension with compression. The patellar button was clamped into place, and excess cement removed. The knee was held at 30 flexion with compression, while the cement cured. The wound was irrigated out with normal saline solution pulse lavage. The rest of the Exparel was injected into the parapatellar arthrotomy, subcutaneous tissues, and periosteal tissues. The parapatellar arthrotomy was closed with running #1 Vicryl suture. The subcutaneous tissue with 0 and 2-0 undyed Vicryl suture, and the skin with running 3-0 SQ vicryl. An Aquacil and Ace  wrap were applied. The patient was taken to recovery room without difficulty.   Kerin Salen 03/06/2019, 9:00 AM

## 2019-03-06 NOTE — Interval H&P Note (Signed)
History and Physical Interval Note:  03/06/2019 7:04 AM  [name]  has presented today for surgery, with the diagnosis of Right Knee Osteoarthtitis.  The various methods of treatment have been discussed with the patient and family. After consideration of risks, benefits and other options for treatment, the patient has consented to  Procedure(s): Right Knee Arthroplasty (Right) as a surgical intervention.  The patient's history has been reviewed, patient examined, no change in status, stable for surgery.  I have reviewed the patient's chart and labs.  Questions were answered to the patient's satisfaction.     Kerin Salen

## 2019-03-06 NOTE — Anesthesia Procedure Notes (Signed)
Procedure Name: MAC Performed by: Eben Burow, CRNA Pre-anesthesia Checklist: Patient identified, Emergency Drugs available, Suction available, Patient being monitored and Timeout performed Oxygen Delivery Method: Simple face mask Dental Injury: Teeth and Oropharynx as per pre-operative assessment

## 2019-03-06 NOTE — Anesthesia Procedure Notes (Signed)
Spinal  Patient location during procedure: OR Start time: 03/06/2019 7:23 AM End time: 03/06/2019 7:27 AM Staffing Anesthesiologist: Audry Pili, MD Performed: anesthesiologist  Preanesthetic Checklist Completed: patient identified, surgical consent, pre-op evaluation, timeout performed, IV checked, risks and benefits discussed and monitors and equipment checked Spinal Block Patient position: sitting Prep: DuraPrep Patient monitoring: heart rate, cardiac monitor, continuous pulse ox and blood pressure Approach: midline Location: L3-4 Injection technique: single-shot Needle Needle type: Quincke  Needle gauge: 22 G Additional Notes Consent was obtained prior to the procedure with all questions answered and concerns addressed. Risks including, but not limited to, bleeding, infection, nerve damage, paralysis, failed block, inadequate analgesia, allergic reaction, high spinal, itching, and headache were discussed and the patient wished to proceed. Functioning IV was confirmed and monitors were applied. Sterile prep and drape, including hand hygiene, mask, and sterile gloves were used. The patient was positioned and the spine was prepped. The skin was anesthetized with lidocaine. Free flow of clear CSF was obtained prior to injecting local anesthetic into the CSF. The spinal needle aspirated freely following injection. The needle was carefully withdrawn. The patient tolerated the procedure well.   Renold Don, MD

## 2019-03-06 NOTE — Evaluation (Signed)
Physical Therapy Evaluation Patient Details Name: [name] MRN: [id number] DOB: [birth year] Today's Date: 03/06/2019   History of Present Illness  s/p R TKA. PMH: HTN, HTN, OSA  Clinical Impression  Pt is s/p TKA resulting in the deficits listed below (see PT Problem List).  Anticipate steady progress, likely ready for d/c  Tomorrow.  amb ~ 73' with RW and min-guard today.   Pt will benefit from skilled PT to increase their independence and safety with mobility to allow discharge to the venue listed below.      Follow Up Recommendations Follow surgeon's recommendation for DC plan and follow-up therapies    Equipment Recommendations  None recommended by PT    Recommendations for Other Services       Precautions / Restrictions Precautions Precautions: Fall;Knee Restrictions Weight Bearing Restrictions: No Other Position/Activity Restrictions: WBAT      Mobility  Bed Mobility Overal bed mobility: Needs Assistance Bed Mobility: Supine to Sit     Supine to sit: Min assist     General bed mobility comments: light assist with RLE  Transfers Overall transfer level: Needs assistance Equipment used: Rolling walker (2 wheeled) Transfers: Sit to/from Stand Sit to Stand: Min assist;From elevated surface         General transfer comment: cues for hand placement  Ambulation/Gait Ambulation/Gait assistance: Min guard;Min assist Gait Distance (Feet): 75 Feet Assistive device: Rolling walker (2 wheeled) Gait Pattern/deviations: Step-to pattern;Step-through pattern     General Gait Details: cues for RW position, safety with turns  Stairs            Wheelchair Mobility    Modified Rankin (Stroke Patients Only)       Balance                                             Pertinent Vitals/Pain Pain Assessment: 0-10 Pain Score: 1  Pain Location: right knee Pain Descriptors / Indicators: Grimacing;Sore Pain Intervention(s):  Monitored during session;Limited activity within patient's tolerance;Repositioned    Home Living Family/patient expects to be discharged to:: Private residence Living Arrangements: Spouse/significant other Available Help at Discharge: Family;Available 24 hours/day Type of Home: House Home Access: Stairs to enter Entrance Stairs-Rails: Left Entrance Stairs-Number of Steps: 4 Home Layout: Two level;Able to live on main level with bedroom/bathroom Home Equipment: Gilford Rile - 2 wheels;Cane - single point      Prior Function                 Hand Dominance        Extremity/Trunk Assessment   Upper Extremity Assessment Upper Extremity Assessment: Overall WFL for tasks assessed    Lower Extremity Assessment Lower Extremity Assessment: RLE deficits/detail RLE Deficits / Details: knee extension and hip flexion 3/5; AROM grossly 5 to 90 degrees knee flxion       Communication   Communication: No difficulties  Cognition Arousal/Alertness: Awake/alert Behavior During Therapy: WFL for tasks assessed/performed Overall Cognitive Status: Within Functional Limits for tasks assessed                                        General Comments      Exercises Total Joint Exercises Ankle Circles/Pumps: AROM;10 reps Quad Sets: 10 reps;Both;AROM   Assessment/Plan    PT Assessment Patient  needs continued PT services  PT Problem List Decreased strength;Decreased range of motion;Decreased mobility;Pain       PT Treatment Interventions Gait training;DME instruction;Therapeutic exercise;Stair training;Therapeutic activities;Patient/family education;Functional mobility training    PT Goals (Current goals can be found in the Care Plan section)  Acute Rehab PT Goals PT Goal Formulation: With patient Time For Goal Achievement: 03/13/19 Potential to Achieve Goals: Good    Frequency 7X/week   Barriers to discharge        Co-evaluation               AM-PAC PT  "6 Clicks" Mobility  Outcome Measure Help needed turning from your back to your side while in a flat bed without using bedrails?: A Little Help needed moving from lying on your back to sitting on the side of a flat bed without using bedrails?: A Little Help needed moving to and from a bed to a chair (including a wheelchair)?: A Little Help needed standing up from a chair using your arms (e.g., wheelchair or bedside chair)?: A Little Help needed to walk in hospital room?: A Little Help needed climbing 3-5 steps with a railing? : A Little 6 Click Score: 18    End of Session Equipment Utilized During Treatment: Gait belt Activity Tolerance: Patient tolerated treatment well Patient left: in chair;with call bell/phone within reach;with chair alarm set   PT Visit Diagnosis: Difficulty in walking, not elsewhere classified (R26.2)    Time: 9179-1505 PT Time Calculation (min) (ACUTE ONLY): 25 min   Charges:   PT Evaluation $PT Eval Low Complexity: 1 Low PT Treatments $Gait Training: 8-22 mins        Kenyon Ana, PT  Pager: [phone] Acute Rehab Dept [facility]): [phone]   03/06/2019   [facility] 03/06/2019, 3:09 PM

## 2019-03-06 NOTE — Anesthesia Procedure Notes (Signed)
Anesthesia Regional Block: Adductor canal block   Pre-Anesthetic Checklist: ,, timeout performed, Correct Patient, Correct Site, Correct Laterality, Correct Procedure, Correct Position, site marked, Risks and benefits discussed,  Surgical consent,  Pre-op evaluation,  At surgeon's request and post-op pain management  Laterality: Right  Prep: chloraprep       Needles:  Injection technique: Single-shot  Needle Type: Echogenic Needle     Needle Length: 10cm  Needle Gauge: 21     Additional Needles:   Narrative:  Start time: 03/06/2019 6:50 AM End time: 03/06/2019 6:53 AM Injection made incrementally with aspirations every 5 mL.  Performed by: Personally  Anesthesiologist: Audry Pili, MD  Additional Notes: No pain on injection. No increased resistance to injection. Injection made in 5cc increments. Good needle visualization. Patient tolerated the procedure well.

## 2019-03-06 NOTE — Transfer of Care (Signed)
Immediate Anesthesia Transfer of Care Note  Patient: Consuello Bossier  Procedure(s) Performed: Right Knee Arthroplasty (Right )  Patient Location: PACU  Anesthesia Type:Spinal  Level of Consciousness: awake, alert  and oriented  Airway & Oxygen Therapy: Patient Spontanous Breathing and Patient connected to face mask oxygen  Post-op Assessment: Report given to RN and Post -op Vital signs reviewed and stable  Post vital signs: Reviewed and stable  Last Vitals:  Vitals Value Taken Time  BP 112/65 03/06/19 0937  Temp    Pulse 88 03/06/19 0938  Resp    SpO2 95 % 03/06/19 0938  Vitals shown include unvalidated device data.  Last Pain:  Vitals:   03/06/19 0557  TempSrc: Oral         Complications: No apparent anesthesia complications

## 2019-03-06 NOTE — Discharge Instructions (Signed)

## 2019-03-07 ENCOUNTER — Encounter (HOSPITAL_COMMUNITY): Payer: Self-pay | Admitting: Orthopedic Surgery

## 2019-03-07 DIAGNOSIS — M1711 Unilateral primary osteoarthritis, right knee: Secondary | ICD-10-CM | POA: Diagnosis not present

## 2019-03-07 LAB — CBC
HCT: 36.8 % — ABNORMAL LOW (ref 39.0–52.0)
Hemoglobin: 11.9 g/dL — ABNORMAL LOW (ref 13.0–17.0)
MCH: 30.1 pg (ref 26.0–34.0)
MCHC: 32.3 g/dL (ref 30.0–36.0)
MCV: 93.2 fL (ref 80.0–100.0)
Platelets: 174 10*3/uL (ref 150–400)
RBC: 3.95 MIL/uL — ABNORMAL LOW (ref 4.22–5.81)
RDW: 13.2 % (ref 11.5–15.5)
WBC: 16.7 10*3/uL — ABNORMAL HIGH (ref 4.0–10.5)
nRBC: 0 % (ref 0.0–0.2)

## 2019-03-07 LAB — BASIC METABOLIC PANEL
Anion gap: 6 (ref 5–15)
BUN: 16 mg/dL (ref 8–23)
CO2: 22 mmol/L (ref 22–32)
Calcium: 8.3 mg/dL — ABNORMAL LOW (ref 8.9–10.3)
Chloride: 106 mmol/L (ref 98–111)
Creatinine, Ser: 0.91 mg/dL (ref 0.61–1.24)
GFR calc Af Amer: >60 mL/min (ref >60)
GFR calc non Af Amer: >60 mL/min (ref >60)
Glucose, Bld: 207 mg/dL — ABNORMAL HIGH (ref 70–99)
Potassium: 4.4 mmol/L (ref 3.5–5.1)
Sodium: 134 mmol/L — ABNORMAL LOW (ref 135–145)

## 2019-03-07 NOTE — Progress Notes (Signed)
   03/07/19 1400  PT Visit Information  Last PT Received On 03/07/19 pt progressing well. Ready for d/c  Assistance Needed +1  History of Present Illness s/p R TKA. PMH: HTN, HTN, OSA  Precautions  Precautions Fall;Knee  Restrictions  RLE Weight Bearing WBAT  Pain Assessment  Pain Assessment 0-10  Pain Score 5  Pain Location right knee  Pain Descriptors / Indicators Grimacing;Sore  Pain Intervention(s) Limited activity within patient's tolerance;Monitored during session;Ice applied  Cognition  Arousal/Alertness Awake/alert  Behavior During Therapy WFL for tasks assessed/performed  Overall Cognitive Status Within Functional Limits for tasks assessed  Total Joint Exercises  Ankle Circles/Pumps AROM;10 reps;Both  Quad Sets 10 reps;Both;AROM  Short Arc Quad Both;10 reps;AROM  Heel Slides AAROM;Right;10 reps  Straight Leg Raises AROM;AAROM;Right;10 reps  Hip ABduction/ADduction AROM;Right;10 reps  Goniometric ROM grossly ~ 5* to 95* right knee flexion arom  PT - End of Session  Equipment Utilized During Treatment Gait belt  Activity Tolerance Patient tolerated treatment well  Patient left in chair;with call bell/phone within reach;with chair alarm set   PT - Assessment/Plan  PT Plan Current plan remains appropriate  PT Visit Diagnosis Difficulty in walking, not elsewhere classified (R26.2)  PT Frequency (ACUTE ONLY) 7X/week  Follow Up Recommendations Follow surgeon's recommendation for DC plan and follow-up therapies  PT equipment None recommended by PT  AM-PAC PT "6 Clicks" Mobility Outcome Measure (Version 2)  Help needed turning from your back to your side while in a flat bed without using bedrails? 3  Help needed moving from lying on your back to sitting on the side of a flat bed without using bedrails? 3  Help needed moving to and from a bed to a chair (including a wheelchair)? 3  Help needed standing up from a chair using your arms (e.g., wheelchair or bedside chair)? 3   Help needed to walk in hospital room? 3  Help needed climbing 3-5 steps with a railing?  3  6 Click Score 18  Consider Recommendation of Discharge To: Home with [facility]  PT Goal Progression  Progress towards PT goals Progressing toward goals  Acute Rehab PT Goals  PT Goal Formulation With patient  Time For Goal Achievement 03/13/19  Potential to Achieve Goals Good  PT Time Calculation  PT Start Time (ACUTE ONLY) 1247  PT Stop Time (ACUTE ONLY) 1306  PT Time Calculation (min) (ACUTE ONLY) 19 min  PT General Charges  $$ ACUTE PT VISIT 1 Visit  PT Treatments  $Therapeutic Exercise 8-22 mins

## 2019-03-07 NOTE — Discharge Summary (Signed)
Patient ID: [name] MRN: [id number] DOB/AGE: [birth year] 78 y.o.  Admit date: 03/06/2019 Discharge date: 03/07/2019  Admission Diagnoses:  Principal Problem:   Osteoarthritis of right knee Active Problems:   Status post total hip replacement, right   Discharge Diagnoses:  Same  Past Medical History:  Diagnosis Date  . Arthritis    "all over" (03/23/2018)  . Chronic lower back pain   . Curvature of spine   . High cholesterol   . Hypertension   . Hypothyroidism   . 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 OSA on CPAP   . PONV (postoperative nausea and vomiting)    "just w/back OR in ~ 1993"  . Prostate CA ([place]) 2001  . Thyroid cancer ([place]) 02/10/2017    Surgeries: Procedure(s): Right Knee Arthroplasty on 03/06/2019   Consultants:   Discharged Condition: Improved  Hospital Course: ISAUL LANDI is an 78 y.o. male who was admitted 03/06/2019 for operative treatment ofOsteoarthritis of right knee. Patient has severe unremitting pain that affects sleep, daily activities, and work/hobbies. After pre-op clearance the patient was taken to the operating room on 03/06/2019 and underwent  Procedure(s): Right Knee Arthroplasty.    Patient was given perioperative antibiotics:  Anti-infectives (From admission, onward)   Start     Dose/Rate Route Frequency Ordered Stop   03/06/19 0600  ceFAZolin (ANCEF) IVPB 2g/100 mL premix     2 g 200 mL/hr over 30 Minutes Intravenous On call to O.R. 03/06/19 0528 03/06/19 0730       Patient was given sequential compression devices, early ambulation, and chemoprophylaxis to prevent DVT.  Patient benefited maximally from hospital stay   and there were no complications.    Recent vital signs:  Patient Vitals for the past 24 hrs:    BP Temp Temp src Pulse Resp SpO2 Height Weight  03/07/19 0541 131/72 97.7 F (36.5 C) no documentation (Abnormal) 57 14 98 % no documentation no documentation  03/07/19 0115 134/76 (Abnormal) 97.5 F (36.4  C) no documentation 72 18 97 % no documentation no documentation  03/06/19 2114 no documentation no documentation no documentation 87 18 97 % no documentation no documentation  03/06/19 2015 (Abnormal) 154/81 97.8 F (36.6 C) no documentation 87 14 97 % no documentation no documentation  03/06/19 1733 138/78 98.3 F (36.8 C) Oral 92 16 98 % no documentation no documentation  03/06/19 1411 134/69 98 F (36.7 C) Oral 89 16 98 % no documentation no documentation  03/06/19 1321 134/77 98 F (36.7 C) no documentation 86 16 99 % no documentation no documentation  03/06/19 1207 136/81 97.7 F (36.5 C) Oral 77 16 99 % no documentation no documentation  03/06/19 1105 136/89 97.9 F (36.6 C) Oral 76 14 99 % 5\' 10"  (1.778 m) 109.8 kg  03/06/19 1045 135/88 98.4 F (36.9 C) no documentation 80 15 97 % no documentation no documentation  03/06/19 1030 121/76 no documentation no documentation 79 18 96 % no documentation no documentation  03/06/19 1015 116/75 no documentation no documentation 83 16 95 % no documentation no documentation  03/06/19 1000 (Abnormal) 99/58 no documentation no documentation 83 16 94 % no documentation no documentation  03/06/19 0945 114/71 no documentation no documentation 89 (Abnormal) 23 97 % no documentation no documentation  03/06/19 0936 112/65 97.9 F (36.6 C) no documentation 89 (Abnormal) 21 96 % no documentation no documentation     Recent laboratory studies:  Recent Labs    03/07/19 0301  WBC 16.7*  HGB 11.9*  HCT 36.8*  PLT 174  NA 134*  K 4.4  CL 106  CO2 22  BUN 16  CREATININE 0.91  GLUCOSE 207*  CALCIUM 8.3*     Discharge Medications:   Allergies as of 03/07/2019    Allergen Reactions Comment   Codeine Other (See Comments) Makes the patient "feel weird"      Medication List    Stop taking these medications   acetaminophen 650 MG CR tablet Commonly known as: TYLENOL     Take these medications   aspirin EC 81 MG tablet Take 1 tablet (81  mg total) by mouth 2 (two) times daily.   CALCIUM 600/VITAMIN D PO Take 1 tablet by mouth daily.   cetirizine 10 MG tablet Commonly known as: ZYRTEC Take 10 mg by mouth daily.   CoQ10 100 MG Caps Take 100 mg by mouth daily.   Fish Oil Ultra 1400 MG Caps Take 1,400 mg by mouth daily.   levothyroxine 175 MCG tablet Commonly known as: SYNTHROID Take 175 mcg by mouth daily before breakfast.   lisinopril 20 MG tablet Commonly known as: ZESTRIL Take 20 mg by mouth daily.   One-A-Day Mens 50+ Advantage Tabs Take 1 tablet by mouth daily.   oxyCODONE-acetaminophen 5-325 MG tablet Commonly known as: PERCOCET/ROXICET Take 1 tablet by mouth every 4 (four) hours as needed for severe pain.   simvastatin 40 MG tablet Commonly known as: ZOCOR Take 40 mg by mouth daily at 6 PM.   timolol 0.5 % ophthalmic solution Commonly known as: BETIMOL Place 1 drop into both eyes daily.   tiZANidine 2 MG tablet Commonly known as: ZANAFLEX Take 1 tablet (2 mg total) by mouth every 6 (six) hours as needed.   Turmeric 500 MG Caps Take 500 mg by mouth 2 (two) times daily.   VITAMIN D-3 PO Take 250 mcg by mouth daily.        Durable Medical Equipment  (From admission, onward)         Start     Ordered   03/06/19 1110  DME Walker rolling  Once    Question:  Patient needs a walker to treat with the following condition    Answer:  Status post right knee replacement   03/06/19 1109   03/06/19 1110  DME 3 n 1  Once     03/06/19 1109           Discharge Care Instructions  (From admission, onward)         Start     Ordered   03/07/19 0000  Change dressing    Comments: Change dressing Only if drainage exceeds 40% of window on dressing   03/07/19 0743          Diagnostic Studies: No results found.  Disposition: Discharge disposition: 01-Home or Self Care       Discharge Instructions    Call MD / Call 911   Complete by: As directed    If you experience chest pain or  shortness of breath, CALL 911 and be transported to the hospital emergency room.  If you develope a fever above 101 F, pus (white drainage) or increased drainage or redness at the wound, or calf pain, call your surgeon's office.   Change dressing   Complete by: As directed    Change dressing Only if drainage exceeds 40% of window on dressing   Constipation Prevention   Complete by: As directed    Drink plenty of fluids.  Prune juice may  be helpful.  You may use a stool softener, such as Colace (over the counter) 100 mg twice a day.  Use MiraLax (over the counter) for constipation as needed.   Diet - low sodium heart healthy   Complete by: As directed    Increase activity slowly as tolerated   Complete by: As directed       Follow-up Information    Frederik Pear, MD In 2 weeks.   Specialty: Orthopedic Surgery Contact information: [place] [id number] [phone]            Signed: Kerin Salen 03/07/2019, 7:44 AM

## 2019-03-07 NOTE — Progress Notes (Signed)
Physical Therapy Treatment Patient Details Name: [name] MRN: [id number] DOB: [birth year] Today's Date: 03/07/2019    History of Present Illness s/p R TKA. PMH: HTN, HTN, OSA    PT Comments    Pt progressing. Will see for a second session to review HEP, should be ready for d/c later this pm   Follow Up Recommendations  Follow surgeon's recommendation for DC plan and follow-up therapies     Equipment Recommendations  None recommended by PT    Recommendations for Other Services       Precautions / Restrictions Precautions Precautions: Fall;Knee Restrictions Weight Bearing Restrictions: No RLE Weight Bearing: Weight bearing as tolerated    Mobility  Bed Mobility Overal bed mobility: Needs Assistance Bed Mobility: Supine to Sit     Supine to sit: Min assist     General bed mobility comments: light assist with RLE  Transfers Overall transfer level: Needs assistance Equipment used: Rolling walker (2 wheeled) Transfers: Sit to/from Stand Sit to Stand: Min assist;From elevated surface         General transfer comment: cues for hand placement  Ambulation/Gait Ambulation/Gait assistance: Min Gaffer (Feet): 70 Feet Assistive device: Rolling walker (2 wheeled) Gait Pattern/deviations: Step-to pattern;Step-through pattern     General Gait Details: cues for RW position, safety with turns   Stairs Stairs: Yes Stairs assistance: Min guard;Min assist Stair Management: One rail Right;One rail Left;Step to pattern;Sideways Number of Stairs: 5(x2) General stair comments: cues for sequence and safe technique   Wheelchair Mobility    Modified Rankin (Stroke Patients Only)       Balance                                            Cognition Arousal/Alertness: Awake/alert Behavior During Therapy: WFL for tasks assessed/performed Overall Cognitive Status: Within Functional Limits for tasks assessed                                         Exercises Total Joint Exercises Ankle Circles/Pumps: AROM;10 reps;Both    General Comments        Pertinent Vitals/Pain Pain Assessment: 0-10 Pain Score: 6  Pain Location: right knee Pain Descriptors / Indicators: Grimacing;Sore Pain Intervention(s): Limited activity within patient's tolerance;Monitored during session    Home Living                      Prior Function            PT Goals (current goals can now be found in the care plan section) Acute Rehab PT Goals PT Goal Formulation: With patient Time For Goal Achievement: 03/13/19 Potential to Achieve Goals: Good Progress towards PT goals: Progressing toward goals    Frequency    7X/week      PT Plan Current plan remains appropriate    Co-evaluation              AM-PAC PT "6 Clicks" Mobility   Outcome Measure  Help needed turning from your back to your side while in a flat bed without using bedrails?: A Little Help needed moving from lying on your back to sitting on the side of a flat bed without using bedrails?: A Little Help needed moving to and from a bed  to a chair (including a wheelchair)?: A Little Help needed standing up from a chair using your arms (e.g., wheelchair or bedside chair)?: A Little Help needed to walk in hospital room?: A Little Help needed climbing 3-5 steps with a railing? : A Little 6 Click Score: 18    End of Session Equipment Utilized During Treatment: Gait belt Activity Tolerance: Patient tolerated treatment well Patient left: in chair;with call bell/phone within reach;with chair alarm set   PT Visit Diagnosis: Difficulty in walking, not elsewhere classified (R26.2)     Time: 0518-3358 PT Time Calculation (min) (ACUTE ONLY): 10 min  Charges:  $Gait Training: 8-22 mins                     Kenyon Ana, PT  Pager: [phone] Acute Rehab Dept [facility]):  [phone]   03/07/2019    [facility] 03/07/2019, 12:47 PM

## 2019-03-07 NOTE — Anesthesia Postprocedure Evaluation (Signed)
Anesthesia Post Note  Patient: Consuello Bossier  Procedure(s) Performed: Right Knee Arthroplasty (Right )     Patient location during evaluation: PACU Anesthesia Type: Spinal Level of consciousness: awake and alert Pain management: pain level controlled Vital Signs Assessment: post-procedure vital signs reviewed and stable Respiratory status: spontaneous breathing, respiratory function stable and patient connected to nasal cannula oxygen Cardiovascular status: blood pressure returned to baseline and stable Postop Assessment: spinal receding and no apparent nausea or vomiting Anesthetic complications: no    Last Vitals:  Vitals:   03/07/19 0921 03/07/19 1319  BP: (!) 105/57 133/70  Pulse: 61 64  Resp: 16 16  Temp: 36.6 C   SpO2: 98% 100%    Last Pain:  Vitals:   03/07/19 0940  TempSrc:   PainSc: 0-No pain                 Audry Pili

## 2019-03-07 NOTE — Progress Notes (Signed)
PATIENT ID: [name]  MRN: [id number]  DOB/AGE:  28-Jul-1941 / 78 y.o.  1 Day Post-Op Procedure(s) (LRB): Right Knee Arthroplasty (Right)    PROGRESS NOTE Subjective: Patient is alert, oriented, No Nausea, No Vomiting, yes passing gas. Taking PO Well. Denies SOB, Chest or Calf Pain. Using Incentive Spirometer, PAS in place. Ambulate In hallway, Patient reports pain as 2/10 .    Objective: Vital signs in last 24 hours: Vitals:   03/06/19 2015 03/06/19 2114 03/07/19 0115 03/07/19 0541  BP: (Abnormal) 154/81  134/76 131/72  Pulse: 87 87 72 (Abnormal) 57  Resp: 14 18 18 14   Temp: 97.8 F (36.6 C)  (Abnormal) 97.5 F (36.4 C) 97.7 F (36.5 C)  TempSrc:      SpO2: 97% 97% 97% 98%  Weight:      Height:          Intake/Output from previous day: I/O last 3 completed shifts: In: 5573.2 [P.O.:600; I.V.:2758.6; IV Piggyback:300] Out: 2750 [Urine:2600; Blood:150]   Intake/Output this shift: No intake/output data recorded.   LABORATORY DATA: Recent Labs    03/07/19 0301  WBC 16.7*  HGB 11.9*  HCT 36.8*  PLT 174  NA 134*  K 4.4  CL 106  CO2 22  BUN 16  CREATININE 0.91  GLUCOSE 207*  CALCIUM 8.3*    Examination: Neurologically intact ABD soft Neurovascular intact Sensation intact distally Intact pulses distally Dorsiflexion/Plantar flexion intact Incision: dressing C/D/I No cellulitis present Compartment soft}  Assessment:   1 Day Post-Op Procedure(s) (LRB): Right Knee Arthroplasty (Right) ADDITIONAL DIAGNOSIS: Expected Acute Blood Loss Anemia, RBB  Patient's anticipated LOS is less than 2 midnights, meeting these requirements: - Younger than 35 - Lives within 1 hour of care - Has a competent adult at home to recover with post-op recover - NO history of  - Chronic pain requiring opiods  - Diabetes  - Coronary Artery Disease  - Heart failure  - Heart attack  - Stroke  - DVT/VTE  - Cardiac arrhythmia  - Respiratory Failure/COPD  - Renal  failure  - Anemia  - Advanced Liver disease       Plan: PT/OT WBAT, AROM and PROM  DVT Prophylaxis:  SCDx72hrs, ASA 81 mg BID x 2 weeks DISCHARGE PLAN: Home, today DISCHARGE NEEDS: HHPT, Walker and 3-in-1 comode seat     Kerin Salen 03/07/2019, 7:34 AM

## 2019-03-07 NOTE — Progress Notes (Signed)
Discharge Plan of Care: Home with [place], then follow up [place].  Has DME

## 2019-03-21 ENCOUNTER — Other Ambulatory Visit (HOSPITAL_COMMUNITY): Payer: Self-pay | Admitting: Orthopedic Surgery

## 2019-03-21 ENCOUNTER — Ambulatory Visit (HOSPITAL_COMMUNITY)
Admission: RE | Admit: 2019-03-21 | Discharge: 2019-03-21 | Disposition: A | Discharge status: Home / Self Care | Payer: Medicare Other | Source: Ambulatory Visit | Attending: Orthopedic Surgery | Admitting: Orthopedic Surgery

## 2019-03-21 ENCOUNTER — Other Ambulatory Visit: Payer: Self-pay

## 2019-03-21 DIAGNOSIS — M79604 Pain in right leg: Secondary | ICD-10-CM

## 2019-03-21 DIAGNOSIS — M7989 Other specified soft tissue disorders: Secondary | ICD-10-CM | POA: Insufficient documentation

## 2019-03-21 NOTE — Progress Notes (Signed)
Lower extremity venous has been completed.   Preliminary results in CV Proc.   [name] 03/21/2019 1:20 PM

## 2019-10-04 IMAGING — US US THYROID
1 series · 14 of 25 positions shown · non-contrast
Comparison: 04/02/2017, 03/24/2017, 12/29/2016

CLINICAL DATA: 75-year-old male with a history of thyroid nodules.

Thyroidectomy performed for papillary thyroid carcinoma 02/12/2017.
Nuclear medicine study 04/02/2017 suggests cervical lymph node
involvement.
EXAM:
THYROID ULTRASOUND
TECHNIQUE: Ultrasound examination of the thyroid gland and adjacent soft
tissues was performed.

[Series 1: us thyroid · 0.06mm/px · 14 of 29 slices shown]
[im 1/29]
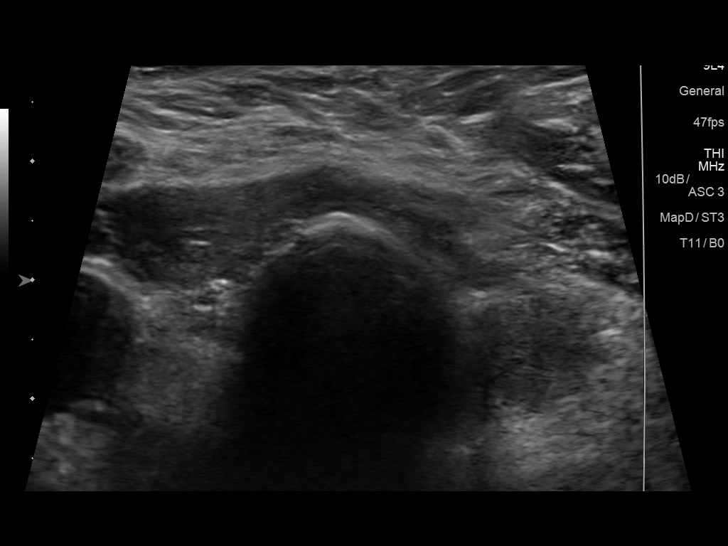
[im 3/29]
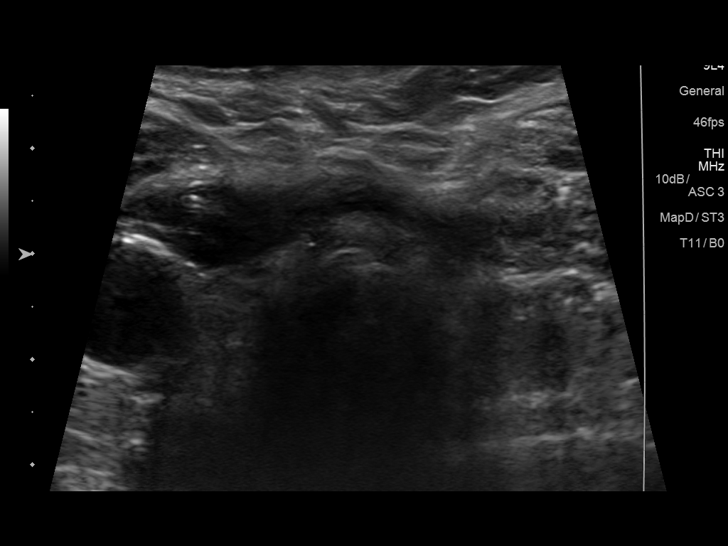
[im 5/29]
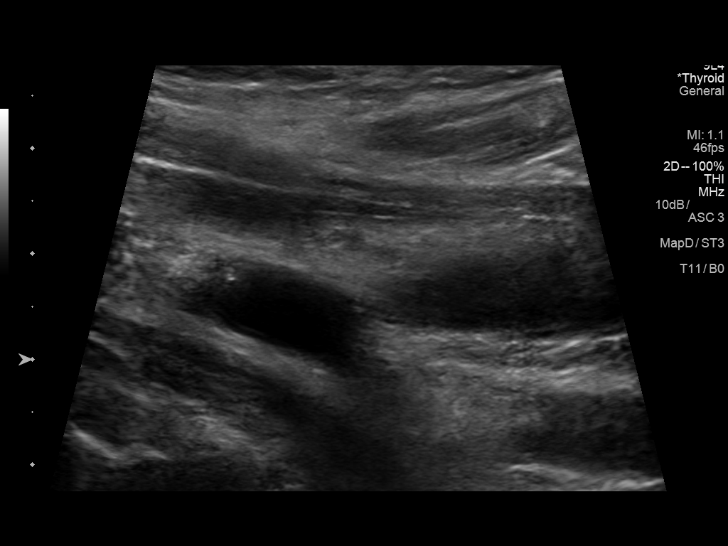
[im 8/29]
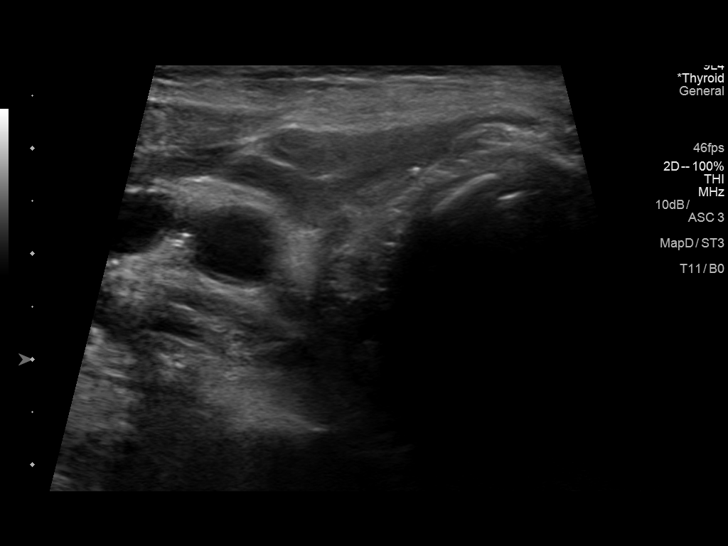
[im 10/29]
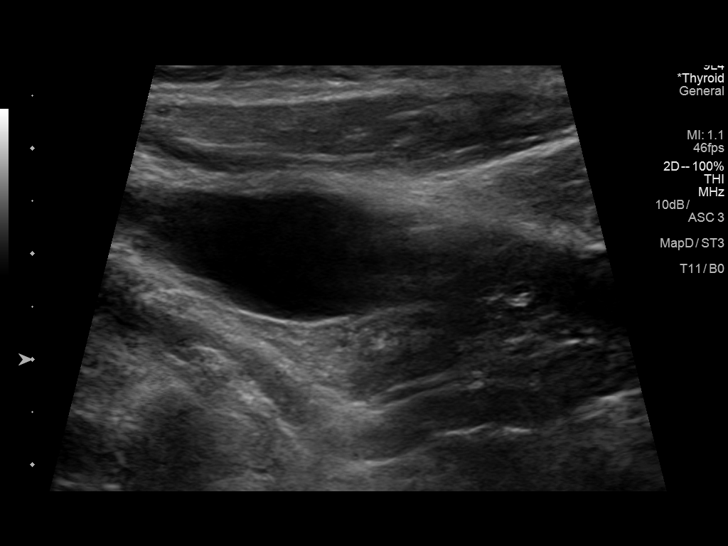
[im 11/29]
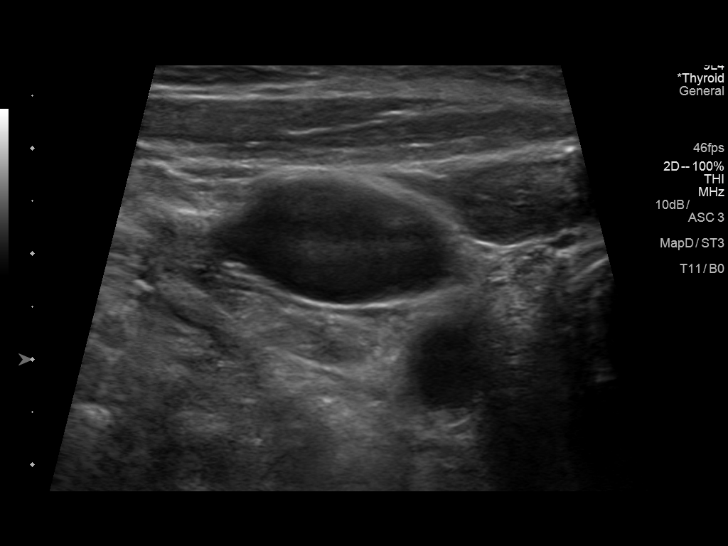
[im 13/29]
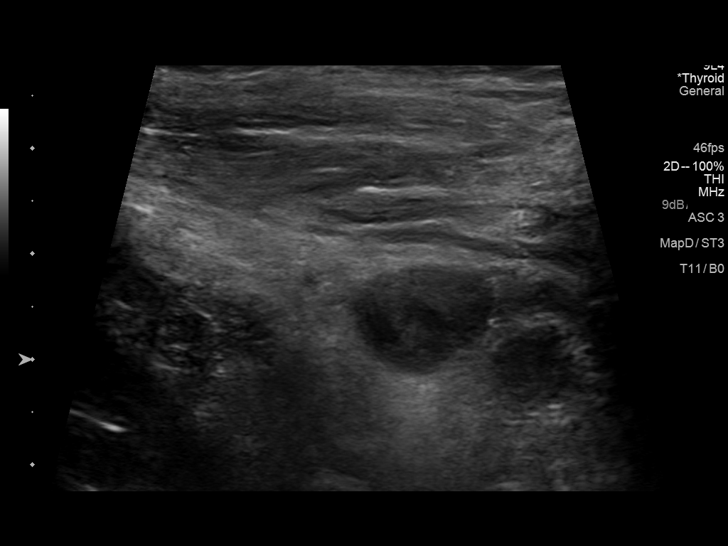
[im 16/29]
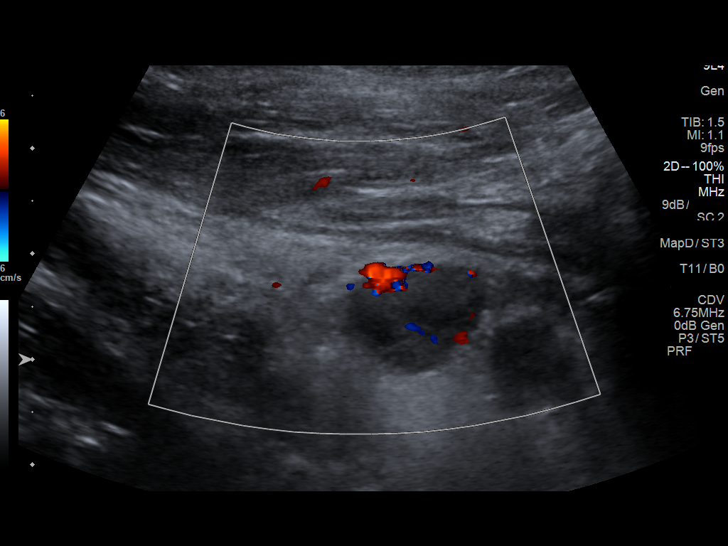
[im 18/29]
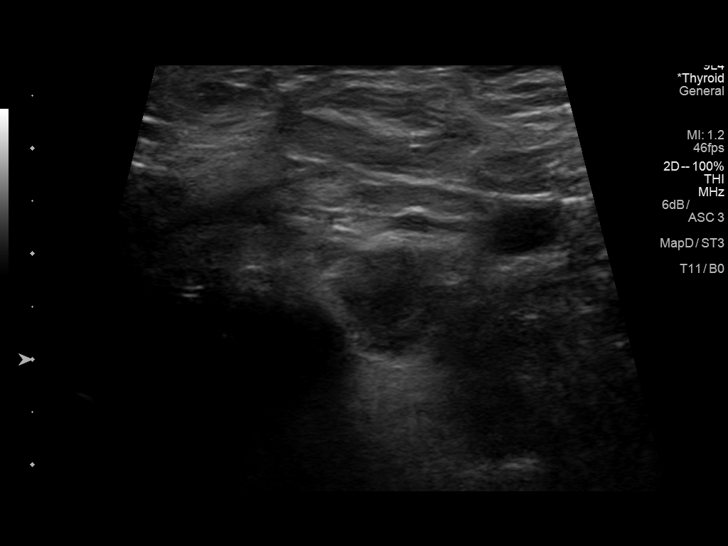
[im 19/29]
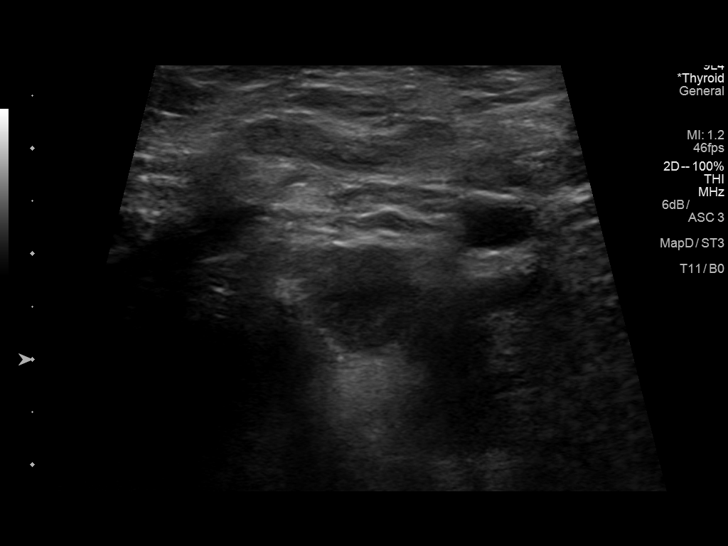
[im 22/29]
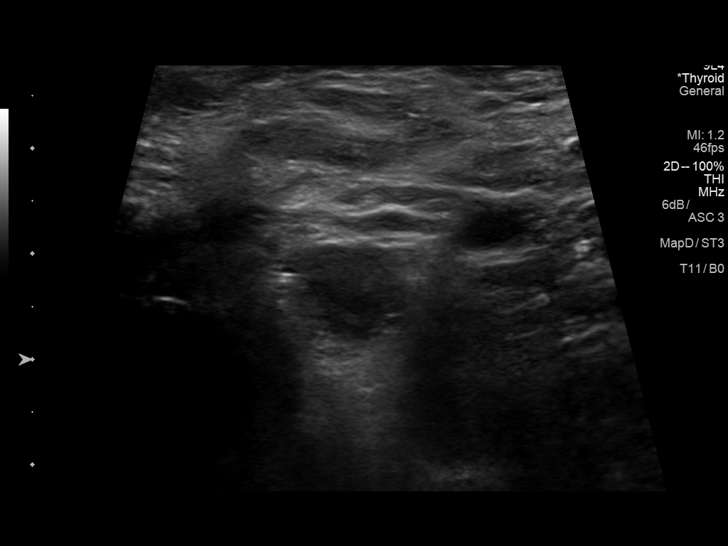
[im 24/29]
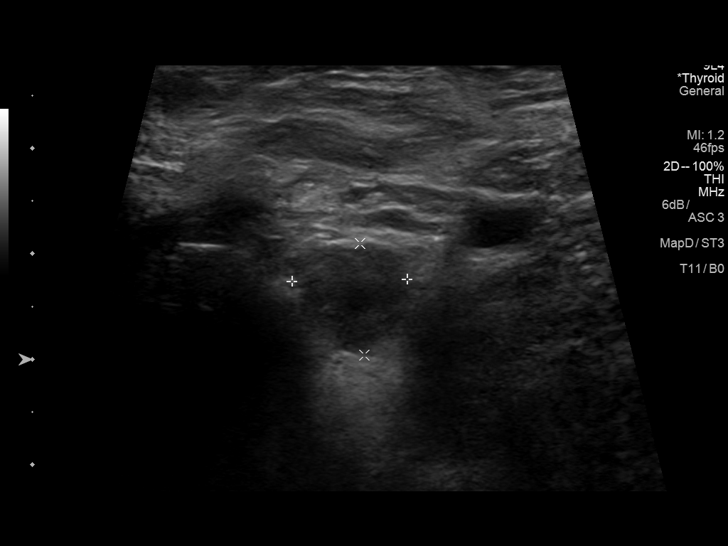
[im 26/29]
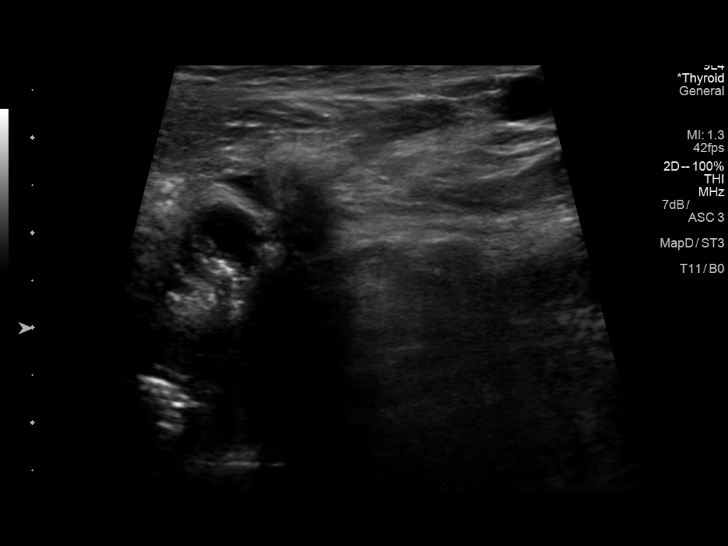
[im 29/29]
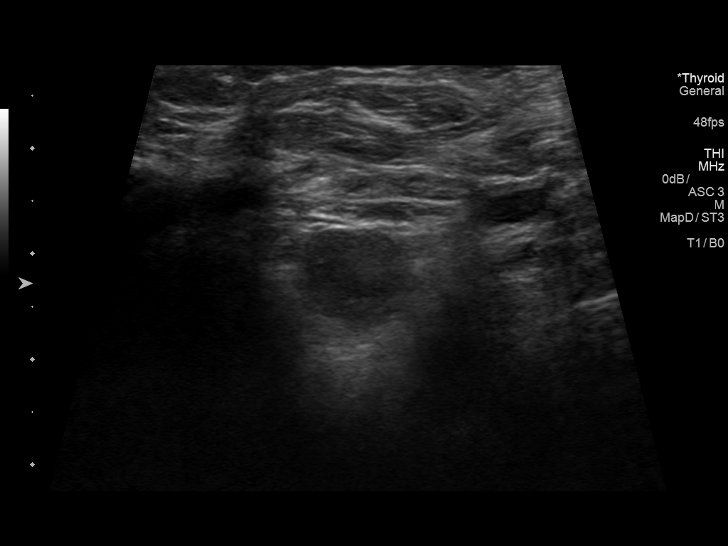

[14 of 25 positions shown; findings below may reference images not displayed]

FINDINGS: Absent thyroid status post thyroidectomy.

There is a uniformly hypoechoic soft tissue nodule within the left
thyroid surgical bed measuring 1.4 cm x 1.1 cm x 1.1 cm.

No focal fluid collection.
IMPRESSION: Status post thyroidectomy there is a soft tissue nodule within the
left surgical bed measuring as large as 1.4 cm, concerning for
recurrence given findings on recent nuclear medicine study
04/02/2017. Recommend correlation with cross-sectional imaging or
alternatively percutaneous biopsy.

## 2019-11-07 DIAGNOSIS — H43811 Vitreous degeneration, right eye: Secondary | ICD-10-CM | POA: Diagnosis not present

## 2019-11-07 DIAGNOSIS — H25813 Combined forms of age-related cataract, bilateral: Secondary | ICD-10-CM | POA: Diagnosis not present

## 2019-11-07 DIAGNOSIS — H401132 Primary open-angle glaucoma, bilateral, moderate stage: Secondary | ICD-10-CM | POA: Diagnosis not present

## 2020-01-05 DIAGNOSIS — C61 Malignant neoplasm of prostate: Secondary | ICD-10-CM | POA: Diagnosis not present

## 2020-01-05 DIAGNOSIS — E782 Mixed hyperlipidemia: Secondary | ICD-10-CM | POA: Diagnosis not present

## 2020-01-05 DIAGNOSIS — Z8546 Personal history of malignant neoplasm of prostate: Secondary | ICD-10-CM | POA: Diagnosis not present

## 2020-01-05 DIAGNOSIS — M1990 Unspecified osteoarthritis, unspecified site: Secondary | ICD-10-CM | POA: Diagnosis not present

## 2020-01-05 DIAGNOSIS — M810 Age-related osteoporosis without current pathological fracture: Secondary | ICD-10-CM | POA: Diagnosis not present

## 2020-01-05 DIAGNOSIS — M179 Osteoarthritis of knee, unspecified: Secondary | ICD-10-CM | POA: Diagnosis not present

## 2020-01-05 DIAGNOSIS — E890 Postprocedural hypothyroidism: Secondary | ICD-10-CM | POA: Diagnosis not present

## 2020-01-05 DIAGNOSIS — I10 Essential (primary) hypertension: Secondary | ICD-10-CM | POA: Diagnosis not present

## 2020-01-05 DIAGNOSIS — I2510 Atherosclerotic heart disease of native coronary artery without angina pectoris: Secondary | ICD-10-CM | POA: Diagnosis not present

## 2020-01-08 DIAGNOSIS — Z8546 Personal history of malignant neoplasm of prostate: Secondary | ICD-10-CM | POA: Diagnosis not present

## 2020-01-08 DIAGNOSIS — D49512 Neoplasm of unspecified behavior of left kidney: Secondary | ICD-10-CM | POA: Diagnosis not present

## 2020-01-16 DIAGNOSIS — E890 Postprocedural hypothyroidism: Secondary | ICD-10-CM | POA: Diagnosis not present

## 2020-01-16 DIAGNOSIS — C770 Secondary and unspecified malignant neoplasm of lymph nodes of head, face and neck: Secondary | ICD-10-CM | POA: Diagnosis not present

## 2020-01-16 DIAGNOSIS — C73 Malignant neoplasm of thyroid gland: Secondary | ICD-10-CM | POA: Diagnosis not present

## 2020-01-23 DIAGNOSIS — H25813 Combined forms of age-related cataract, bilateral: Secondary | ICD-10-CM | POA: Diagnosis not present

## 2020-01-23 DIAGNOSIS — H401132 Primary open-angle glaucoma, bilateral, moderate stage: Secondary | ICD-10-CM | POA: Diagnosis not present

## 2020-01-23 DIAGNOSIS — H43811 Vitreous degeneration, right eye: Secondary | ICD-10-CM | POA: Diagnosis not present

## 2020-01-24 DIAGNOSIS — E890 Postprocedural hypothyroidism: Secondary | ICD-10-CM | POA: Diagnosis not present

## 2020-01-24 DIAGNOSIS — C73 Malignant neoplasm of thyroid gland: Secondary | ICD-10-CM | POA: Diagnosis not present

## 2020-01-24 DIAGNOSIS — R682 Dry mouth, unspecified: Secondary | ICD-10-CM | POA: Diagnosis not present

## 2020-01-24 DIAGNOSIS — C770 Secondary and unspecified malignant neoplasm of lymph nodes of head, face and neck: Secondary | ICD-10-CM | POA: Diagnosis not present

## 2020-01-29 DIAGNOSIS — H2511 Age-related nuclear cataract, right eye: Secondary | ICD-10-CM | POA: Diagnosis not present

## 2020-01-29 DIAGNOSIS — H25813 Combined forms of age-related cataract, bilateral: Secondary | ICD-10-CM | POA: Diagnosis not present

## 2020-01-29 DIAGNOSIS — H2513 Age-related nuclear cataract, bilateral: Secondary | ICD-10-CM | POA: Diagnosis not present

## 2020-03-21 ENCOUNTER — Ambulatory Visit: Payer: Medicare PPO | Attending: Internal Medicine

## 2020-03-21 DIAGNOSIS — Z23 Encounter for immunization: Secondary | ICD-10-CM

## 2020-03-21 DIAGNOSIS — I10 Essential (primary) hypertension: Secondary | ICD-10-CM | POA: Diagnosis not present

## 2020-03-21 DIAGNOSIS — D369 Benign neoplasm, unspecified site: Secondary | ICD-10-CM | POA: Diagnosis not present

## 2020-03-21 DIAGNOSIS — E782 Mixed hyperlipidemia: Secondary | ICD-10-CM | POA: Diagnosis not present

## 2020-03-21 DIAGNOSIS — C73 Malignant neoplasm of thyroid gland: Secondary | ICD-10-CM | POA: Diagnosis not present

## 2020-03-21 DIAGNOSIS — I2510 Atherosclerotic heart disease of native coronary artery without angina pectoris: Secondary | ICD-10-CM | POA: Diagnosis not present

## 2020-03-21 DIAGNOSIS — R7303 Prediabetes: Secondary | ICD-10-CM | POA: Diagnosis not present

## 2020-03-21 DIAGNOSIS — E890 Postprocedural hypothyroidism: Secondary | ICD-10-CM | POA: Diagnosis not present

## 2020-03-21 DIAGNOSIS — G4730 Sleep apnea, unspecified: Secondary | ICD-10-CM | POA: Diagnosis not present

## 2020-03-21 DIAGNOSIS — J309 Allergic rhinitis, unspecified: Secondary | ICD-10-CM | POA: Diagnosis not present

## 2020-03-21 NOTE — Progress Notes (Signed)
   Covid-19 Vaccination Clinic  Name:  [name]    MRN: [id number] DOB: Dec 07, 1940  03/21/2020  Mr. Alexander was observed post Covid-19 immunization for 15 minutes without incident. He was provided with Vaccine Information Sheet and instruction to access the V-Safe system.   Mr. Saffran was instructed to call 911 with any severe reactions post vaccine: Marland Kitchen Difficulty breathing  . Swelling of face and throat  . A fast heartbeat  . A bad rash all over body  . Dizziness and weakness   Immunizations Administered    Name Date Dose VIS Date Route   Pfizer COVID-19 Vaccine 03/21/2020 12:49 PM 0.3 mL 10/18/2018 Intramuscular   Manufacturer: [place]   Lot: [id number]   [place]: [id number]

## 2020-04-08 DIAGNOSIS — H2512 Age-related nuclear cataract, left eye: Secondary | ICD-10-CM | POA: Diagnosis not present

## 2020-04-16 ENCOUNTER — Ambulatory Visit: Payer: Medicare PPO | Attending: Internal Medicine

## 2020-04-16 DIAGNOSIS — Z23 Encounter for immunization: Secondary | ICD-10-CM

## 2020-04-16 NOTE — Progress Notes (Signed)
   Covid-19 Vaccination Clinic  Name:  [name]    MRN: [id number] DOB: 1940-10-14  04/16/2020  [name] was observed post Covid-19 immunization for 15 minutes without incident. He was provided with Vaccine Information Sheet and instruction to access the V-Safe system.   [name] was instructed to call 911 with any severe reactions post vaccine: Marland Kitchen Difficulty breathing  . Swelling of face and throat  . A fast heartbeat  . A bad rash all over body  . Dizziness and weakness   Immunizations Administered    Name Date Dose VIS Date Route   Pfizer COVID-19 Vaccine 04/16/2020  3:05 PM 0.3 mL 10/18/2018 Intramuscular   Manufacturer: [place]   Lot: [id number]   [place]: [id number]

## 2020-04-26 DIAGNOSIS — G4733 Obstructive sleep apnea (adult) (pediatric): Secondary | ICD-10-CM | POA: Diagnosis not present

## 2020-07-09 DIAGNOSIS — Z8546 Personal history of malignant neoplasm of prostate: Secondary | ICD-10-CM | POA: Diagnosis not present

## 2020-07-09 DIAGNOSIS — D3002 Benign neoplasm of left kidney: Secondary | ICD-10-CM | POA: Diagnosis not present

## 2020-07-12 DIAGNOSIS — C770 Secondary and unspecified malignant neoplasm of lymph nodes of head, face and neck: Secondary | ICD-10-CM | POA: Diagnosis not present

## 2020-07-12 DIAGNOSIS — C73 Malignant neoplasm of thyroid gland: Secondary | ICD-10-CM | POA: Diagnosis not present

## 2020-07-12 DIAGNOSIS — Z23 Encounter for immunization: Secondary | ICD-10-CM | POA: Diagnosis not present

## 2020-07-12 DIAGNOSIS — E890 Postprocedural hypothyroidism: Secondary | ICD-10-CM | POA: Diagnosis not present

## 2020-07-25 DIAGNOSIS — C770 Secondary and unspecified malignant neoplasm of lymph nodes of head, face and neck: Secondary | ICD-10-CM | POA: Diagnosis not present

## 2020-07-25 DIAGNOSIS — C73 Malignant neoplasm of thyroid gland: Secondary | ICD-10-CM | POA: Diagnosis not present

## 2020-07-25 DIAGNOSIS — G4733 Obstructive sleep apnea (adult) (pediatric): Secondary | ICD-10-CM | POA: Diagnosis not present

## 2020-07-25 DIAGNOSIS — R682 Dry mouth, unspecified: Secondary | ICD-10-CM | POA: Diagnosis not present

## 2020-07-25 DIAGNOSIS — E890 Postprocedural hypothyroidism: Secondary | ICD-10-CM | POA: Diagnosis not present

## 2020-07-28 ENCOUNTER — Other Ambulatory Visit: Payer: Self-pay | Admitting: Nurse Practitioner

## 2020-07-28 DIAGNOSIS — R059 Cough, unspecified: Secondary | ICD-10-CM | POA: Diagnosis not present

## 2020-07-28 DIAGNOSIS — U071 COVID-19: Secondary | ICD-10-CM | POA: Diagnosis not present

## 2020-07-28 DIAGNOSIS — R0981 Nasal congestion: Secondary | ICD-10-CM | POA: Diagnosis not present

## 2020-07-29 ENCOUNTER — Other Ambulatory Visit (HOSPITAL_COMMUNITY): Payer: Self-pay

## 2020-07-29 ENCOUNTER — Telehealth: Payer: Self-pay | Admitting: Nurse Practitioner

## 2020-07-29 DIAGNOSIS — U071 COVID-19: Secondary | ICD-10-CM

## 2020-07-29 NOTE — Telephone Encounter (Signed)
Called to discuss with [name] about Covid symptoms and the use of a monoclonal antibody infusion for those with mild to moderate Covid symptoms and at a high risk of hospitalization.     Pt is qualified for this infusion at the [place] infusion center due to co-morbid conditions and/or a member of an at-risk group, however declines infusion at this time. Patient would like to receive infusion today and our first available appointment isn't until 12/8. They will call back if interested. Onset of symptoms was 07/27/20. Patient is fully vaccinated. Last vaccine in August 2021- [place].   Symptoms tier reviewed as well as criteria for ending isolation.  Symptoms reviewed that would warrant ED/Hospital evaluation. Preventative practices reviewed. Patient verbalized understanding. Patient advised to call back if he/she opts to proceed with infusion. Callback number provided. Urgent care and/or ER precautions given for severe symptoms.    Patient Active Problem List   Diagnosis Date Noted  . Status post total hip replacement, right 03/06/2019  . Osteoarthritis of right knee 03/03/2019  . Primary osteoarthritis of left knee 03/23/2018  . Degenerative arthritis of left knee 03/22/2018  . Right bundle branch block (RBBB) on electrocardiogram (ECG) 03/18/2018  . Hematoma of neck 02/14/2017  . Thyroid cancer ([place]) 02/12/2017     Jose Rutter, NP [phone] [name]

## 2020-09-05 DIAGNOSIS — C770 Secondary and unspecified malignant neoplasm of lymph nodes of head, face and neck: Secondary | ICD-10-CM | POA: Diagnosis not present

## 2020-09-05 DIAGNOSIS — C73 Malignant neoplasm of thyroid gland: Secondary | ICD-10-CM | POA: Diagnosis not present

## 2020-09-05 DIAGNOSIS — E890 Postprocedural hypothyroidism: Secondary | ICD-10-CM | POA: Diagnosis not present

## 2020-09-26 DIAGNOSIS — I2510 Atherosclerotic heart disease of native coronary artery without angina pectoris: Secondary | ICD-10-CM | POA: Diagnosis not present

## 2020-09-26 DIAGNOSIS — R7309 Other abnormal glucose: Secondary | ICD-10-CM | POA: Diagnosis not present

## 2020-09-26 DIAGNOSIS — I10 Essential (primary) hypertension: Secondary | ICD-10-CM | POA: Diagnosis not present

## 2020-09-26 DIAGNOSIS — E782 Mixed hyperlipidemia: Secondary | ICD-10-CM | POA: Diagnosis not present

## 2020-09-26 DIAGNOSIS — I700 Atherosclerosis of aorta: Secondary | ICD-10-CM | POA: Diagnosis not present

## 2020-09-26 DIAGNOSIS — E890 Postprocedural hypothyroidism: Secondary | ICD-10-CM | POA: Diagnosis not present

## 2020-09-26 DIAGNOSIS — Z1389 Encounter for screening for other disorder: Secondary | ICD-10-CM | POA: Diagnosis not present

## 2020-09-26 DIAGNOSIS — G4733 Obstructive sleep apnea (adult) (pediatric): Secondary | ICD-10-CM | POA: Diagnosis not present

## 2020-09-26 DIAGNOSIS — Z8546 Personal history of malignant neoplasm of prostate: Secondary | ICD-10-CM | POA: Diagnosis not present

## 2020-09-26 DIAGNOSIS — Z0000 Encounter for general adult medical examination without abnormal findings: Secondary | ICD-10-CM | POA: Diagnosis not present

## 2020-09-26 DIAGNOSIS — C73 Malignant neoplasm of thyroid gland: Secondary | ICD-10-CM | POA: Diagnosis not present

## 2020-10-23 DIAGNOSIS — G4733 Obstructive sleep apnea (adult) (pediatric): Secondary | ICD-10-CM | POA: Diagnosis not present

## 2020-10-24 DIAGNOSIS — H401132 Primary open-angle glaucoma, bilateral, moderate stage: Secondary | ICD-10-CM | POA: Diagnosis not present

## 2020-12-19 DIAGNOSIS — Z03818 Encounter for observation for suspected exposure to other biological agents ruled out: Secondary | ICD-10-CM | POA: Diagnosis not present

## 2020-12-19 DIAGNOSIS — R0981 Nasal congestion: Secondary | ICD-10-CM | POA: Diagnosis not present

## 2020-12-19 DIAGNOSIS — R059 Cough, unspecified: Secondary | ICD-10-CM | POA: Diagnosis not present

## 2020-12-19 DIAGNOSIS — B349 Viral infection, unspecified: Secondary | ICD-10-CM | POA: Diagnosis not present

## 2020-12-30 ENCOUNTER — Ambulatory Visit
Admission: RE | Admit: 2020-12-30 | Discharge: 2020-12-30 | Disposition: A | Discharge status: Home / Self Care | Payer: Medicare PPO | Source: Ambulatory Visit | Attending: Internal Medicine | Admitting: Internal Medicine

## 2020-12-30 ENCOUNTER — Other Ambulatory Visit: Payer: Self-pay | Admitting: Internal Medicine

## 2020-12-30 DIAGNOSIS — S0993XA Unspecified injury of face, initial encounter: Secondary | ICD-10-CM

## 2020-12-30 DIAGNOSIS — M79641 Pain in right hand: Secondary | ICD-10-CM

## 2020-12-30 DIAGNOSIS — M25531 Pain in right wrist: Secondary | ICD-10-CM | POA: Diagnosis not present

## 2020-12-30 DIAGNOSIS — J342 Deviated nasal septum: Secondary | ICD-10-CM | POA: Diagnosis not present

## 2020-12-30 DIAGNOSIS — M7989 Other specified soft tissue disorders: Secondary | ICD-10-CM | POA: Diagnosis not present

## 2020-12-30 DIAGNOSIS — M810 Age-related osteoporosis without current pathological fracture: Secondary | ICD-10-CM | POA: Diagnosis not present

## 2020-12-30 DIAGNOSIS — M19041 Primary osteoarthritis, right hand: Secondary | ICD-10-CM | POA: Diagnosis not present

## 2021-01-15 DIAGNOSIS — Z8546 Personal history of malignant neoplasm of prostate: Secondary | ICD-10-CM | POA: Diagnosis not present

## 2021-01-15 DIAGNOSIS — D3002 Benign neoplasm of left kidney: Secondary | ICD-10-CM | POA: Diagnosis not present

## 2021-01-21 DIAGNOSIS — K5730 Diverticulosis of large intestine without perforation or abscess without bleeding: Secondary | ICD-10-CM | POA: Diagnosis not present

## 2021-01-21 DIAGNOSIS — D3002 Benign neoplasm of left kidney: Secondary | ICD-10-CM | POA: Diagnosis not present

## 2021-01-22 DIAGNOSIS — Z8546 Personal history of malignant neoplasm of prostate: Secondary | ICD-10-CM | POA: Diagnosis not present

## 2021-01-22 DIAGNOSIS — D3002 Benign neoplasm of left kidney: Secondary | ICD-10-CM | POA: Diagnosis not present

## 2021-01-23 DIAGNOSIS — E890 Postprocedural hypothyroidism: Secondary | ICD-10-CM | POA: Diagnosis not present

## 2021-01-23 DIAGNOSIS — C73 Malignant neoplasm of thyroid gland: Secondary | ICD-10-CM | POA: Diagnosis not present

## 2021-01-23 DIAGNOSIS — C770 Secondary and unspecified malignant neoplasm of lymph nodes of head, face and neck: Secondary | ICD-10-CM | POA: Diagnosis not present

## 2021-01-29 DIAGNOSIS — E890 Postprocedural hypothyroidism: Secondary | ICD-10-CM | POA: Diagnosis not present

## 2021-01-29 DIAGNOSIS — C770 Secondary and unspecified malignant neoplasm of lymph nodes of head, face and neck: Secondary | ICD-10-CM | POA: Diagnosis not present

## 2021-01-29 DIAGNOSIS — C73 Malignant neoplasm of thyroid gland: Secondary | ICD-10-CM | POA: Diagnosis not present

## 2021-02-05 DIAGNOSIS — C73 Malignant neoplasm of thyroid gland: Secondary | ICD-10-CM | POA: Diagnosis not present

## 2021-02-05 DIAGNOSIS — E890 Postprocedural hypothyroidism: Secondary | ICD-10-CM | POA: Diagnosis not present

## 2021-02-05 DIAGNOSIS — C770 Secondary and unspecified malignant neoplasm of lymph nodes of head, face and neck: Secondary | ICD-10-CM | POA: Diagnosis not present

## 2021-02-23 DIAGNOSIS — R059 Cough, unspecified: Secondary | ICD-10-CM | POA: Diagnosis not present

## 2021-02-23 DIAGNOSIS — R0981 Nasal congestion: Secondary | ICD-10-CM | POA: Diagnosis not present

## 2021-02-23 DIAGNOSIS — J40 Bronchitis, not specified as acute or chronic: Secondary | ICD-10-CM | POA: Diagnosis not present

## 2021-02-23 DIAGNOSIS — Z03818 Encounter for observation for suspected exposure to other biological agents ruled out: Secondary | ICD-10-CM | POA: Diagnosis not present

## 2021-02-23 DIAGNOSIS — U071 COVID-19: Secondary | ICD-10-CM | POA: Diagnosis not present

## 2021-03-28 DIAGNOSIS — Z8546 Personal history of malignant neoplasm of prostate: Secondary | ICD-10-CM | POA: Diagnosis not present

## 2021-03-28 DIAGNOSIS — I700 Atherosclerosis of aorta: Secondary | ICD-10-CM | POA: Diagnosis not present

## 2021-03-28 DIAGNOSIS — Z85850 Personal history of malignant neoplasm of thyroid: Secondary | ICD-10-CM | POA: Diagnosis not present

## 2021-03-28 DIAGNOSIS — E782 Mixed hyperlipidemia: Secondary | ICD-10-CM | POA: Diagnosis not present

## 2021-03-28 DIAGNOSIS — E890 Postprocedural hypothyroidism: Secondary | ICD-10-CM | POA: Diagnosis not present

## 2021-03-28 DIAGNOSIS — G4733 Obstructive sleep apnea (adult) (pediatric): Secondary | ICD-10-CM | POA: Diagnosis not present

## 2021-03-28 DIAGNOSIS — I10 Essential (primary) hypertension: Secondary | ICD-10-CM | POA: Diagnosis not present

## 2021-03-28 DIAGNOSIS — R7303 Prediabetes: Secondary | ICD-10-CM | POA: Diagnosis not present

## 2021-04-03 DIAGNOSIS — G4733 Obstructive sleep apnea (adult) (pediatric): Secondary | ICD-10-CM | POA: Diagnosis not present

## 2021-05-08 DIAGNOSIS — H43813 Vitreous degeneration, bilateral: Secondary | ICD-10-CM | POA: Diagnosis not present

## 2021-05-08 DIAGNOSIS — H401132 Primary open-angle glaucoma, bilateral, moderate stage: Secondary | ICD-10-CM | POA: Diagnosis not present

## 2021-07-25 DIAGNOSIS — C770 Secondary and unspecified malignant neoplasm of lymph nodes of head, face and neck: Secondary | ICD-10-CM | POA: Diagnosis not present

## 2021-07-25 DIAGNOSIS — C73 Malignant neoplasm of thyroid gland: Secondary | ICD-10-CM | POA: Diagnosis not present

## 2021-07-25 DIAGNOSIS — E890 Postprocedural hypothyroidism: Secondary | ICD-10-CM | POA: Diagnosis not present

## 2021-07-30 DIAGNOSIS — D3002 Benign neoplasm of left kidney: Secondary | ICD-10-CM | POA: Diagnosis not present

## 2021-07-30 DIAGNOSIS — Z8546 Personal history of malignant neoplasm of prostate: Secondary | ICD-10-CM | POA: Diagnosis not present

## 2021-08-01 DIAGNOSIS — C73 Malignant neoplasm of thyroid gland: Secondary | ICD-10-CM | POA: Diagnosis not present

## 2021-08-01 DIAGNOSIS — E890 Postprocedural hypothyroidism: Secondary | ICD-10-CM | POA: Diagnosis not present

## 2021-08-01 DIAGNOSIS — C770 Secondary and unspecified malignant neoplasm of lymph nodes of head, face and neck: Secondary | ICD-10-CM | POA: Diagnosis not present

## 2021-08-13 DIAGNOSIS — E041 Nontoxic single thyroid nodule: Secondary | ICD-10-CM | POA: Diagnosis not present

## 2021-08-13 DIAGNOSIS — E890 Postprocedural hypothyroidism: Secondary | ICD-10-CM | POA: Diagnosis not present

## 2021-08-13 DIAGNOSIS — C73 Malignant neoplasm of thyroid gland: Secondary | ICD-10-CM | POA: Diagnosis not present

## 2021-08-13 DIAGNOSIS — Z9889 Other specified postprocedural states: Secondary | ICD-10-CM | POA: Diagnosis not present

## 2021-08-13 DIAGNOSIS — C770 Secondary and unspecified malignant neoplasm of lymph nodes of head, face and neck: Secondary | ICD-10-CM | POA: Diagnosis not present

## 2021-08-14 ENCOUNTER — Other Ambulatory Visit (HOSPITAL_COMMUNITY): Payer: Self-pay | Admitting: Internal Medicine

## 2021-08-14 DIAGNOSIS — C73 Malignant neoplasm of thyroid gland: Secondary | ICD-10-CM

## 2021-08-14 NOTE — Written Directive (Addendum)
MOLECULAR IMAGING AND THERAPEUTICS WRITTEN DIRECTIVE   PATIENT NAMESiddh Roman [place]  PT DOB:   03-06-1941                                              MRN: [id number]  ---------------------------------------------------------------------------------------------------------------------   I-131 THYROID CANCER THERAPY   RADIOPHARMACEUTICAL:  Iodine-131 Capsule    PRESCRIBED DOSE FOR ADMINISTRATION: 150 mCi   ROUTE OFADMINISTRATION:  PO   DIAGNOSIS:Thyroid cancer    REFERRING PHYSICIAN:Kerr,Jeffery   THYROGEN STIMULATION OR HORMONE WITHDRAW:Thyrogen   REMNANT ABLATION OR ADJUVANT THERAPY:Adjuvant treatment   DATE OF THYROIDECTOMY:02/12/2017   SURGEON:Rosen,Jefry   TSH:   No results found for: TSH   PRIOR I-131 THERAPY (Date and Dose): 150 mCi - 03/24/17 146 mCi - 10/25/17 208 mCi- 11/04/18    Pathology:  Cell type: [x]   Papillary  []   Follicular  []   [place]   Largest tumor focus:   3.5   cm  Extrathyroidal Extension?     Yes [x]   No []     Lymphovascular Invasion?  Yes  [x]   No  []     Margins positive ? Yes [x]   No []     Lymph nodes positive? Yes [x]   No  []       # positive nodes:  3 # negative nodes:  4   TNM staging: pT:    3b     PN: 1b        Mx:    ADDITIONAL PHYSICIAN COMMENTS/NOTES  Increasing thyroglobulin level  - upto 34 07/25/21. Fourth (and final per Buddy Duty note)  I 131 treatment AUTHORIZED USER SIGNATURE & TIME STAMP:

## 2021-08-28 DIAGNOSIS — J209 Acute bronchitis, unspecified: Secondary | ICD-10-CM | POA: Diagnosis not present

## 2021-09-08 DIAGNOSIS — J0191 Acute recurrent sinusitis, unspecified: Secondary | ICD-10-CM | POA: Diagnosis not present

## 2021-09-11 DIAGNOSIS — H43813 Vitreous degeneration, bilateral: Secondary | ICD-10-CM | POA: Diagnosis not present

## 2021-09-11 DIAGNOSIS — H401132 Primary open-angle glaucoma, bilateral, moderate stage: Secondary | ICD-10-CM | POA: Diagnosis not present

## 2021-09-22 ENCOUNTER — Other Ambulatory Visit: Payer: Self-pay

## 2021-09-22 ENCOUNTER — Ambulatory Visit (HOSPITAL_COMMUNITY)
Admission: RE | Admit: 2021-09-22 | Discharge: 2021-09-22 | Disposition: A | Discharge status: Home / Self Care | Payer: Medicare PPO | Source: Ambulatory Visit | Attending: Internal Medicine | Admitting: Internal Medicine

## 2021-09-22 DIAGNOSIS — C73 Malignant neoplasm of thyroid gland: Secondary | ICD-10-CM | POA: Diagnosis not present

## 2021-09-22 MED ORDER — STERILE WATER FOR INJECTION IJ SOLN
INTRAMUSCULAR | Status: AC
  Filled 2021-09-22: qty 10

## 2021-09-22 MED ORDER — THYROTROPIN ALFA 0.9 MG IM SOLR
0.9000 mg | INTRAMUSCULAR | Status: AC
  Administered 2021-09-22: 0.9 mg via INTRAMUSCULAR

## 2021-09-23 ENCOUNTER — Ambulatory Visit (HOSPITAL_COMMUNITY)
Admission: RE | Admit: 2021-09-23 | Discharge: 2021-09-23 | Disposition: A | Discharge status: Home / Self Care | Payer: Medicare PPO | Source: Ambulatory Visit | Attending: Internal Medicine | Admitting: Internal Medicine

## 2021-09-23 ENCOUNTER — Encounter (HOSPITAL_COMMUNITY): Payer: Self-pay

## 2021-09-23 DIAGNOSIS — C73 Malignant neoplasm of thyroid gland: Secondary | ICD-10-CM | POA: Diagnosis not present

## 2021-09-23 MED ORDER — THYROTROPIN ALFA 0.9 MG IM SOLR
0.9000 mg | INTRAMUSCULAR | Status: AC
  Administered 2021-09-23: 0.9 mg via INTRAMUSCULAR

## 2021-09-23 MED ORDER — STERILE WATER FOR INJECTION IJ SOLN
INTRAMUSCULAR | Status: AC
  Filled 2021-09-23: qty 10

## 2021-09-24 ENCOUNTER — Encounter (HOSPITAL_COMMUNITY): Payer: Self-pay

## 2021-09-24 ENCOUNTER — Other Ambulatory Visit: Payer: Self-pay

## 2021-09-24 ENCOUNTER — Ambulatory Visit (HOSPITAL_COMMUNITY): Admission: RE | Admit: 2021-09-24 | Payer: Medicare PPO | Source: Ambulatory Visit

## 2021-09-29 ENCOUNTER — Encounter (HOSPITAL_COMMUNITY)
Admission: RE | Admit: 2021-09-29 | Discharge: 2021-09-29 | Disposition: A | Discharge status: Home / Self Care | Payer: Medicare PPO | Source: Ambulatory Visit | Attending: Internal Medicine | Admitting: Internal Medicine

## 2021-09-29 ENCOUNTER — Encounter (HOSPITAL_COMMUNITY): Payer: Self-pay

## 2021-09-29 ENCOUNTER — Other Ambulatory Visit: Payer: Self-pay

## 2021-09-29 ENCOUNTER — Ambulatory Visit (HOSPITAL_COMMUNITY): Payer: Medicare PPO

## 2021-09-29 DIAGNOSIS — C73 Malignant neoplasm of thyroid gland: Secondary | ICD-10-CM | POA: Insufficient documentation

## 2021-09-29 MED ORDER — THYROTROPIN ALFA 0.9 MG IM SOLR
0.9000 mg | INTRAMUSCULAR | Status: AC
  Filled 2021-09-29: qty 0.9

## 2021-09-29 MED ORDER — THYROTROPIN ALFA 0.9 MG IM SOLR
0.9000 mg | INTRAMUSCULAR | Status: AC
Start: 2021-09-29 — End: 2021-09-29
  Administered 2021-09-29: 0.9 mg via INTRAMUSCULAR

## 2021-09-30 ENCOUNTER — Encounter (HOSPITAL_COMMUNITY)
Admission: RE | Admit: 2021-09-30 | Discharge: 2021-09-30 | Disposition: A | Discharge status: Home / Self Care | Payer: Medicare PPO | Source: Ambulatory Visit | Attending: Internal Medicine | Admitting: Internal Medicine

## 2021-09-30 DIAGNOSIS — C73 Malignant neoplasm of thyroid gland: Secondary | ICD-10-CM | POA: Diagnosis not present

## 2021-09-30 MED ORDER — THYROTROPIN ALFA 0.9 MG IM SOLR
0.9000 mg | INTRAMUSCULAR | Status: AC
  Administered 2021-09-30: 0.9 mg via INTRAMUSCULAR

## 2021-09-30 MED ORDER — STERILE WATER FOR INJECTION IJ SOLN
INTRAMUSCULAR | Status: AC
  Filled 2021-09-30: qty 10

## 2021-09-30 MED ORDER — STERILE WATER FOR INJECTION IJ SOLN: 0.9000 mL | Freq: Once | INTRAMUSCULAR | Status: DC

## 2021-10-01 ENCOUNTER — Other Ambulatory Visit: Payer: Self-pay

## 2021-10-01 ENCOUNTER — Encounter (HOSPITAL_COMMUNITY)
Admission: RE | Admit: 2021-10-01 | Discharge: 2021-10-01 | Disposition: A | Discharge status: Home / Self Care | Payer: Medicare PPO | Source: Ambulatory Visit | Attending: Internal Medicine | Admitting: Internal Medicine

## 2021-10-01 DIAGNOSIS — C73 Malignant neoplasm of thyroid gland: Secondary | ICD-10-CM | POA: Diagnosis not present

## 2021-10-03 ENCOUNTER — Ambulatory Visit (HOSPITAL_COMMUNITY): Payer: Medicare PPO

## 2021-10-07 DIAGNOSIS — M810 Age-related osteoporosis without current pathological fracture: Secondary | ICD-10-CM | POA: Diagnosis not present

## 2021-10-07 DIAGNOSIS — I10 Essential (primary) hypertension: Secondary | ICD-10-CM | POA: Diagnosis not present

## 2021-10-07 DIAGNOSIS — C770 Secondary and unspecified malignant neoplasm of lymph nodes of head, face and neck: Secondary | ICD-10-CM | POA: Diagnosis not present

## 2021-10-07 DIAGNOSIS — R7303 Prediabetes: Secondary | ICD-10-CM | POA: Diagnosis not present

## 2021-10-07 DIAGNOSIS — I700 Atherosclerosis of aorta: Secondary | ICD-10-CM | POA: Diagnosis not present

## 2021-10-07 DIAGNOSIS — Z8546 Personal history of malignant neoplasm of prostate: Secondary | ICD-10-CM | POA: Diagnosis not present

## 2021-10-07 DIAGNOSIS — C73 Malignant neoplasm of thyroid gland: Secondary | ICD-10-CM | POA: Diagnosis not present

## 2021-10-07 DIAGNOSIS — Z0000 Encounter for general adult medical examination without abnormal findings: Secondary | ICD-10-CM | POA: Diagnosis not present

## 2021-10-07 DIAGNOSIS — Z1389 Encounter for screening for other disorder: Secondary | ICD-10-CM | POA: Diagnosis not present

## 2021-10-07 DIAGNOSIS — E782 Mixed hyperlipidemia: Secondary | ICD-10-CM | POA: Diagnosis not present

## 2021-10-07 DIAGNOSIS — E890 Postprocedural hypothyroidism: Secondary | ICD-10-CM | POA: Diagnosis not present

## 2021-10-07 DIAGNOSIS — I2510 Atherosclerotic heart disease of native coronary artery without angina pectoris: Secondary | ICD-10-CM | POA: Diagnosis not present

## 2021-10-10 ENCOUNTER — Ambulatory Visit (HOSPITAL_COMMUNITY)
Admission: RE | Admit: 2021-10-10 | Discharge: 2021-10-10 | Disposition: A | Discharge status: Home / Self Care | Payer: Medicare PPO | Source: Ambulatory Visit | Attending: Internal Medicine | Admitting: Internal Medicine

## 2021-10-10 ENCOUNTER — Other Ambulatory Visit: Payer: Self-pay

## 2021-10-10 DIAGNOSIS — C73 Malignant neoplasm of thyroid gland: Secondary | ICD-10-CM | POA: Diagnosis not present

## 2021-10-10 DIAGNOSIS — E890 Postprocedural hypothyroidism: Secondary | ICD-10-CM | POA: Diagnosis not present

## 2021-10-30 DIAGNOSIS — C73 Malignant neoplasm of thyroid gland: Secondary | ICD-10-CM | POA: Diagnosis not present

## 2021-10-30 DIAGNOSIS — E890 Postprocedural hypothyroidism: Secondary | ICD-10-CM | POA: Diagnosis not present

## 2021-10-30 DIAGNOSIS — C770 Secondary and unspecified malignant neoplasm of lymph nodes of head, face and neck: Secondary | ICD-10-CM | POA: Diagnosis not present

## 2021-10-30 DIAGNOSIS — R682 Dry mouth, unspecified: Secondary | ICD-10-CM | POA: Diagnosis not present

## 2022-01-15 DIAGNOSIS — H401132 Primary open-angle glaucoma, bilateral, moderate stage: Secondary | ICD-10-CM | POA: Diagnosis not present

## 2022-01-15 DIAGNOSIS — H43813 Vitreous degeneration, bilateral: Secondary | ICD-10-CM | POA: Diagnosis not present

## 2022-01-27 DIAGNOSIS — M7062 Trochanteric bursitis, left hip: Secondary | ICD-10-CM | POA: Diagnosis not present

## 2022-01-27 DIAGNOSIS — M25552 Pain in left hip: Secondary | ICD-10-CM | POA: Diagnosis not present

## 2022-03-31 DIAGNOSIS — I10 Essential (primary) hypertension: Secondary | ICD-10-CM | POA: Diagnosis not present

## 2022-04-07 DIAGNOSIS — E890 Postprocedural hypothyroidism: Secondary | ICD-10-CM | POA: Diagnosis not present

## 2022-04-07 DIAGNOSIS — R7303 Prediabetes: Secondary | ICD-10-CM | POA: Diagnosis not present

## 2022-04-07 DIAGNOSIS — I700 Atherosclerosis of aorta: Secondary | ICD-10-CM | POA: Diagnosis not present

## 2022-04-07 DIAGNOSIS — Z8546 Personal history of malignant neoplasm of prostate: Secondary | ICD-10-CM | POA: Diagnosis not present

## 2022-04-07 DIAGNOSIS — K219 Gastro-esophageal reflux disease without esophagitis: Secondary | ICD-10-CM | POA: Diagnosis not present

## 2022-04-07 DIAGNOSIS — E782 Mixed hyperlipidemia: Secondary | ICD-10-CM | POA: Diagnosis not present

## 2022-04-07 DIAGNOSIS — G4733 Obstructive sleep apnea (adult) (pediatric): Secondary | ICD-10-CM | POA: Diagnosis not present

## 2022-04-07 DIAGNOSIS — Z85850 Personal history of malignant neoplasm of thyroid: Secondary | ICD-10-CM | POA: Diagnosis not present

## 2022-04-07 DIAGNOSIS — I10 Essential (primary) hypertension: Secondary | ICD-10-CM | POA: Diagnosis not present

## 2022-04-28 DIAGNOSIS — E890 Postprocedural hypothyroidism: Secondary | ICD-10-CM | POA: Diagnosis not present

## 2022-04-28 DIAGNOSIS — C73 Malignant neoplasm of thyroid gland: Secondary | ICD-10-CM | POA: Diagnosis not present

## 2022-04-28 DIAGNOSIS — C770 Secondary and unspecified malignant neoplasm of lymph nodes of head, face and neck: Secondary | ICD-10-CM | POA: Diagnosis not present

## 2022-05-05 DIAGNOSIS — E890 Postprocedural hypothyroidism: Secondary | ICD-10-CM | POA: Diagnosis not present

## 2022-05-05 DIAGNOSIS — C770 Secondary and unspecified malignant neoplasm of lymph nodes of head, face and neck: Secondary | ICD-10-CM | POA: Diagnosis not present

## 2022-05-05 DIAGNOSIS — R682 Dry mouth, unspecified: Secondary | ICD-10-CM | POA: Diagnosis not present

## 2022-05-05 DIAGNOSIS — C73 Malignant neoplasm of thyroid gland: Secondary | ICD-10-CM | POA: Diagnosis not present

## 2022-05-08 ENCOUNTER — Other Ambulatory Visit: Payer: Self-pay | Admitting: Internal Medicine

## 2022-05-08 DIAGNOSIS — C73 Malignant neoplasm of thyroid gland: Secondary | ICD-10-CM

## 2022-05-08 DIAGNOSIS — C770 Secondary and unspecified malignant neoplasm of lymph nodes of head, face and neck: Secondary | ICD-10-CM

## 2022-05-19 DIAGNOSIS — H43813 Vitreous degeneration, bilateral: Secondary | ICD-10-CM | POA: Diagnosis not present

## 2022-05-19 DIAGNOSIS — H401132 Primary open-angle glaucoma, bilateral, moderate stage: Secondary | ICD-10-CM | POA: Diagnosis not present

## 2022-05-27 DIAGNOSIS — I10 Essential (primary) hypertension: Secondary | ICD-10-CM | POA: Diagnosis not present

## 2022-06-02 ENCOUNTER — Ambulatory Visit
Admission: RE | Admit: 2022-06-02 | Discharge: 2022-06-02 | Disposition: A | Discharge status: Home / Self Care | Payer: Medicare PPO | Source: Ambulatory Visit | Attending: Internal Medicine | Admitting: Internal Medicine

## 2022-06-02 DIAGNOSIS — C73 Malignant neoplasm of thyroid gland: Secondary | ICD-10-CM | POA: Diagnosis not present

## 2022-06-02 DIAGNOSIS — I771 Stricture of artery: Secondary | ICD-10-CM | POA: Diagnosis not present

## 2022-06-02 DIAGNOSIS — J9811 Atelectasis: Secondary | ICD-10-CM | POA: Diagnosis not present

## 2022-06-02 DIAGNOSIS — C770 Secondary and unspecified malignant neoplasm of lymph nodes of head, face and neck: Secondary | ICD-10-CM

## 2022-06-02 DIAGNOSIS — I6523 Occlusion and stenosis of bilateral carotid arteries: Secondary | ICD-10-CM | POA: Diagnosis not present

## 2022-06-02 DIAGNOSIS — E890 Postprocedural hypothyroidism: Secondary | ICD-10-CM | POA: Diagnosis not present

## 2022-06-02 DIAGNOSIS — I672 Cerebral atherosclerosis: Secondary | ICD-10-CM | POA: Diagnosis not present

## 2022-06-02 DIAGNOSIS — J929 Pleural plaque without asbestos: Secondary | ICD-10-CM | POA: Diagnosis not present

## 2022-06-02 DIAGNOSIS — K449 Diaphragmatic hernia without obstruction or gangrene: Secondary | ICD-10-CM | POA: Diagnosis not present

## 2022-06-02 MED ORDER — IOPAMIDOL (ISOVUE-300) INJECTION 61%
80.0000 mL | Freq: Once | INTRAVENOUS | Status: AC | PRN
  Administered 2022-06-02: 80 mL via INTRAVENOUS

## 2022-06-08 DIAGNOSIS — E890 Postprocedural hypothyroidism: Secondary | ICD-10-CM | POA: Diagnosis not present

## 2022-06-08 DIAGNOSIS — R5383 Other fatigue: Secondary | ICD-10-CM | POA: Diagnosis not present

## 2022-06-08 DIAGNOSIS — C770 Secondary and unspecified malignant neoplasm of lymph nodes of head, face and neck: Secondary | ICD-10-CM | POA: Diagnosis not present

## 2022-06-08 DIAGNOSIS — R682 Dry mouth, unspecified: Secondary | ICD-10-CM | POA: Diagnosis not present

## 2022-06-08 DIAGNOSIS — G4709 Other insomnia: Secondary | ICD-10-CM | POA: Diagnosis not present

## 2022-06-08 DIAGNOSIS — C73 Malignant neoplasm of thyroid gland: Secondary | ICD-10-CM | POA: Diagnosis not present

## 2022-06-08 DIAGNOSIS — C7800 Secondary malignant neoplasm of unspecified lung: Secondary | ICD-10-CM | POA: Diagnosis not present

## 2022-06-12 ENCOUNTER — Other Ambulatory Visit: Payer: Self-pay | Admitting: Internal Medicine

## 2022-06-12 DIAGNOSIS — C73 Malignant neoplasm of thyroid gland: Secondary | ICD-10-CM

## 2022-06-19 ENCOUNTER — Other Ambulatory Visit: Payer: Medicare PPO

## 2022-06-25 DIAGNOSIS — C799 Secondary malignant neoplasm of unspecified site: Secondary | ICD-10-CM | POA: Diagnosis not present

## 2022-06-25 DIAGNOSIS — C73 Malignant neoplasm of thyroid gland: Secondary | ICD-10-CM | POA: Diagnosis not present

## 2022-06-25 DIAGNOSIS — I10 Essential (primary) hypertension: Secondary | ICD-10-CM | POA: Diagnosis not present

## 2022-06-30 DIAGNOSIS — C770 Secondary and unspecified malignant neoplasm of lymph nodes of head, face and neck: Secondary | ICD-10-CM | POA: Diagnosis not present

## 2022-06-30 DIAGNOSIS — C7800 Secondary malignant neoplasm of unspecified lung: Secondary | ICD-10-CM | POA: Diagnosis not present

## 2022-06-30 DIAGNOSIS — R682 Dry mouth, unspecified: Secondary | ICD-10-CM | POA: Diagnosis not present

## 2022-06-30 DIAGNOSIS — E890 Postprocedural hypothyroidism: Secondary | ICD-10-CM | POA: Diagnosis not present

## 2022-06-30 DIAGNOSIS — C73 Malignant neoplasm of thyroid gland: Secondary | ICD-10-CM | POA: Diagnosis not present

## 2022-06-30 DIAGNOSIS — R002 Palpitations: Secondary | ICD-10-CM | POA: Diagnosis not present

## 2022-07-02 ENCOUNTER — Other Ambulatory Visit (HOSPITAL_COMMUNITY): Payer: Self-pay | Admitting: Internal Medicine

## 2022-07-02 DIAGNOSIS — C73 Malignant neoplasm of thyroid gland: Secondary | ICD-10-CM

## 2022-07-02 DIAGNOSIS — R911 Solitary pulmonary nodule: Secondary | ICD-10-CM

## 2022-07-02 DIAGNOSIS — M954 Acquired deformity of chest and rib: Secondary | ICD-10-CM

## 2022-07-02 DIAGNOSIS — C770 Secondary and unspecified malignant neoplasm of lymph nodes of head, face and neck: Secondary | ICD-10-CM

## 2022-07-10 ENCOUNTER — Telehealth: Payer: Self-pay | Admitting: Internal Medicine

## 2022-07-10 ENCOUNTER — Other Ambulatory Visit (HOSPITAL_COMMUNITY): Payer: Self-pay | Admitting: Internal Medicine

## 2022-07-10 DIAGNOSIS — C73 Malignant neoplasm of thyroid gland: Secondary | ICD-10-CM

## 2022-07-10 DIAGNOSIS — C770 Secondary and unspecified malignant neoplasm of lymph nodes of head, face and neck: Secondary | ICD-10-CM

## 2022-07-10 DIAGNOSIS — C799 Secondary malignant neoplasm of unspecified site: Secondary | ICD-10-CM

## 2022-07-10 DIAGNOSIS — C7800 Secondary malignant neoplasm of unspecified lung: Secondary | ICD-10-CM

## 2022-07-10 NOTE — Progress Notes (Signed)
Per Dr. Serafina Royals, ok to proceed with cervical lymph node biopsy ([place]). JM

## 2022-07-10 NOTE — Addendum Note (Signed)
Encounter addended by: Joanell Rising on: 07/10/2022 12:18 PM  Actions taken: Clinical Note Signed

## 2022-07-10 NOTE — Telephone Encounter (Signed)
LM at [facility] for Dr Ulice Brilliant nurse to fax over a new order for US lymph node BX to [phone]

## 2022-07-13 DIAGNOSIS — C73 Malignant neoplasm of thyroid gland: Secondary | ICD-10-CM | POA: Diagnosis not present

## 2022-07-13 DIAGNOSIS — E890 Postprocedural hypothyroidism: Secondary | ICD-10-CM | POA: Diagnosis not present

## 2022-07-13 DIAGNOSIS — R5383 Other fatigue: Secondary | ICD-10-CM | POA: Diagnosis not present

## 2022-07-15 ENCOUNTER — Encounter: Payer: Self-pay | Admitting: Internal Medicine

## 2022-07-15 DIAGNOSIS — C73 Malignant neoplasm of thyroid gland: Secondary | ICD-10-CM

## 2022-07-24 ENCOUNTER — Ambulatory Visit (HOSPITAL_COMMUNITY)
Admission: RE | Admit: 2022-07-24 | Discharge: 2022-07-24 | Disposition: A | Discharge status: Home / Self Care | Payer: Medicare PPO | Source: Ambulatory Visit | Attending: Internal Medicine | Admitting: Internal Medicine

## 2022-07-24 DIAGNOSIS — R911 Solitary pulmonary nodule: Secondary | ICD-10-CM | POA: Diagnosis not present

## 2022-07-24 DIAGNOSIS — M954 Acquired deformity of chest and rib: Secondary | ICD-10-CM | POA: Diagnosis not present

## 2022-07-24 DIAGNOSIS — C73 Malignant neoplasm of thyroid gland: Secondary | ICD-10-CM | POA: Diagnosis not present

## 2022-07-24 DIAGNOSIS — C770 Secondary and unspecified malignant neoplasm of lymph nodes of head, face and neck: Secondary | ICD-10-CM | POA: Diagnosis not present

## 2022-07-24 LAB — GLUCOSE, CAPILLARY: Glucose-Capillary: 125 mg/dL — ABNORMAL HIGH (ref 70–99)

## 2022-07-24 MED ORDER — FLUDEOXYGLUCOSE F - 18 (FDG) INJECTION
12.1000 | Freq: Once | INTRAVENOUS | Status: AC | PRN
  Administered 2022-07-24: 12 via INTRAVENOUS

## 2022-07-28 ENCOUNTER — Other Ambulatory Visit (HOSPITAL_COMMUNITY): Payer: Self-pay | Admitting: Physician Assistant

## 2022-07-28 DIAGNOSIS — C61 Malignant neoplasm of prostate: Secondary | ICD-10-CM | POA: Diagnosis not present

## 2022-07-28 DIAGNOSIS — Z8546 Personal history of malignant neoplasm of prostate: Secondary | ICD-10-CM | POA: Diagnosis not present

## 2022-07-29 ENCOUNTER — Other Ambulatory Visit: Payer: Self-pay

## 2022-07-29 ENCOUNTER — Encounter (HOSPITAL_COMMUNITY): Payer: Self-pay

## 2022-07-29 ENCOUNTER — Ambulatory Visit (HOSPITAL_COMMUNITY)
Admission: RE | Admit: 2022-07-29 | Discharge: 2022-07-29 | Disposition: A | Discharge status: Home / Self Care | Payer: Medicare PPO | Source: Ambulatory Visit | Attending: Internal Medicine | Admitting: Internal Medicine

## 2022-07-29 DIAGNOSIS — C73 Malignant neoplasm of thyroid gland: Secondary | ICD-10-CM | POA: Insufficient documentation

## 2022-07-29 DIAGNOSIS — C770 Secondary and unspecified malignant neoplasm of lymph nodes of head, face and neck: Secondary | ICD-10-CM | POA: Diagnosis not present

## 2022-07-29 DIAGNOSIS — R590 Localized enlarged lymph nodes: Secondary | ICD-10-CM | POA: Diagnosis not present

## 2022-07-29 DIAGNOSIS — C7800 Secondary malignant neoplasm of unspecified lung: Secondary | ICD-10-CM | POA: Insufficient documentation

## 2022-07-29 MED ORDER — LIDOCAINE HCL (PF) 1 % IJ SOLN
INTRAMUSCULAR | Status: AC
  Filled 2022-07-29: qty 30

## 2022-07-29 MED ORDER — LIDOCAINE HCL (PF) 1 % IJ SOLN: 6.0000 mL | Freq: Once | INTRAMUSCULAR | Status: DC

## 2022-07-29 NOTE — Procedures (Signed)
Interventional Radiology Procedure Note  Procedure: [place] LEFT CERVICAL NODE CORE BX    Complications: None  Estimated Blood Loss:  MIN  Findings: 18 G CORES IN SALINE    Tamera Punt, MD

## 2022-07-29 NOTE — H&P (Signed)
Chief Complaint: Patient was seen in consultation today for cervical lymph node biopsy at the request of [place]  Referring Physician(s): Kerr,Jeffrey  Supervising Physician: Daryll Brod  Patient Status: [facility] - Out-pt  History of Present Illness: [name] is a 81 y.o. male   Remote hx prostate cancer Hx thyroid cancer 2018 Follows with Dr Buddy Duty Pt felt "knot in neck" and sought MD evaluation PET 07/24/22: IMPRESSION: 1. Tracer avid lymph nodes are identified within the left level 2 cervical node station, left paratracheal, and superior mediastinum compatible with nodal metastasis. 2. No signs of tracer avid metastatic disease to the abdomen or pelvis or bony skeleton. 3. 9 mm left lower lobe lung nodule is identified without significant tracer uptake. This is favored to represent a benign abnormality.  Scheduled now for cervical lymph node biopsy   Past Medical History:  Diagnosis Date   Arthritis    "all over" (03/23/2018)   Chronic lower back pain    Curvature of spine    High cholesterol    Hypertension    Hypothyroidism    Kidney cysts 02/10/2017   bilateral   OSA on CPAP    PONV (postoperative nausea and vomiting)    "just w/back OR in ~ 1993"   Prostate CA ([place]) 2001   Thyroid cancer ([place]) 02/10/2017    Past Surgical History:  Procedure Laterality Date   BACK SURGERY     CARDIAC CATHETERIZATION  2002   HEMATOMA EVACUATION N/A 02/13/2017   Procedure: EVACUATION HEMATOMA, NECK;  Surgeon: Melida Quitter, MD;  Location: [place];  Service: ENT;  Laterality: N/A;   [place]   "herniated"   PROSTATECTOMY  2001   RADICAL NECK DISSECTION Left 02/12/2017   Procedure: RADICAL NECK DISSECTION;  Surgeon: Izora Gala, MD;  Location: [place];  Service: ENT;  Laterality: Left;  Left modified neck dissection levels 1 through 4   THYROIDECTOMY N/A 02/12/2017   Procedure: THYROIDECTOMY;  Surgeon:  Izora Gala, MD;  Location: [place];  Service: ENT;  Laterality: N/A;  total   TONSILLECTOMY     TOTAL KNEE ARTHROPLASTY Left 03/23/2018   TOTAL KNEE ARTHROPLASTY Left 03/23/2018   Procedure: TOTAL KNEE ARTHROPLASTY;  Surgeon: Frederik Pear, MD;  Location: [place];  Service: Orthopedics;  Laterality: Left;   TOTAL KNEE ARTHROPLASTY Right 03/06/2019   Procedure: Right Knee Arthroplasty;  Surgeon: Frederik Pear, MD;  Location: [facility];  Service: Orthopedics;  Laterality: Right;   UMBILICAL HERNIA REPAIR      Allergies: Codeine  Medications: Prior to Admission medications   Medication Sig Start Date End Date Taking? Authorizing Provider  aspirin EC 81 MG tablet Take 1 tablet (81 mg total) by mouth 2 (two) times daily. 03/06/19   Leighton Parody, PA-C  Calcium Carbonate-Vitamin D (CALCIUM 600/VITAMIN D PO) Take 1 tablet by mouth daily.    [provider]  cetirizine (ZYRTEC) 10 MG tablet Take 10 mg by mouth daily.    [provider]  Cholecalciferol (VITAMIN D-3 PO) Take 250 mcg by mouth daily.     [provider]  Coenzyme Q10 (COQ10) 100 MG CAPS Take 100 mg by mouth daily.     [provider]  ILEVRO 0.3 % ophthalmic suspension  01/31/20   [provider]  levothyroxine (SYNTHROID, LEVOTHROID) 175 MCG tablet Take 175 mcg by mouth daily before breakfast.    [provider]  lisinopril (PRINIVIL,ZESTRIL) 20 MG tablet Take 20 mg by mouth daily.    [provider]  Multiple Vitamins-Minerals (ONE-A-DAY MENS 50+ ADVANTAGE) TABS Take 1 tablet by mouth daily.    [provider]  ofloxacin (OCUFLOX) 0.3 % ophthalmic solution  02/01/20   [provider]  Omega-3 Fatty Acids (FISH OIL ULTRA) 1400 MG CAPS Take 1,400 mg by mouth daily.     [provider]  oxyCODONE-acetaminophen (PERCOCET/ROXICET) 5-325 MG tablet Take 1 tablet by mouth every 4 (four) hours as needed for severe pain. 03/06/19   Leighton Parody, PA-C   PROLENSA 0.07 % SOLN  04/10/20   [provider]  simvastatin (ZOCOR) 40 MG tablet Take 40 mg by mouth daily at 6 PM.    [provider]  timolol (BETIMOL) 0.5 % ophthalmic solution Place 1 drop into both eyes daily.     [provider]  timolol (TIMOPTIC) 0.5 % ophthalmic solution  03/18/20   [provider]  tiZANidine (ZANAFLEX) 2 MG tablet Take 1 tablet (2 mg total) by mouth every 6 (six) hours as needed. 03/06/19   Leighton Parody, PA-C  Turmeric 500 MG CAPS Take 500 mg by mouth 2 (two) times daily.     [provider]     Family History  Problem Relation Age of Onset   Lymphoma Mother    Pancreatic cancer Father     Social History   Socioeconomic History   Marital status: Married    Spouse name: Not on file   Number of children: Not on file   Years of education: Not on file   Highest education level: Not on file  Occupational History   Not on file  Tobacco Use   Smoking status: Former    Types: Pipe    Quit date: 33    Years since quitting: 50.9   Smokeless tobacco: Never  Vaping Use   Vaping Use: Never used  Substance and Sexual Activity   Alcohol use: Yes    Comment: rare   Drug use: Never   Sexual activity: Not on file  Other Topics Concern   Not on file  Social History Narrative   Not on file   Social Determinants of Health   Financial Resource Strain: Not on file  Food Insecurity: Not on file  Transportation Needs: Not on file  Physical Activity: Not on file  Stress: Not on file  Social Connections: Not on file    Review of Systems: A 12 point ROS discussed and pertinent positives are indicated in the HPI above.  All other systems are negative.  Review of Systems  Constitutional:  Negative for activity change, fatigue and unexpected weight change.  HENT:  Negative for facial swelling and sore throat.   Respiratory:  Negative for cough and shortness of breath.   Cardiovascular:  Negative for chest pain.   Gastrointestinal:  Negative for abdominal pain and nausea.  Psychiatric/Behavioral:  Negative for behavioral problems and confusion.     Vital Signs: There were no vitals taken for this visit.   Physical Exam Vitals reviewed.  HENT:     Mouth/Throat:     Mouth: Mucous membranes are moist.  Cardiovascular:     Rate and Rhythm: Normal rate and regular rhythm.     Heart sounds: Normal heart sounds.  Pulmonary:     Effort: Pulmonary effort is normal.     Breath sounds: Normal breath sounds.  Abdominal:     Palpations: Abdomen  is soft.  Musculoskeletal:        General: Normal range of motion.  Skin:    General: Skin is warm.  Neurological:     Mental Status: He is alert and oriented to person, place, and time.  Psychiatric:        Behavior: Behavior normal.     Imaging: NM PET Image Initial (PI) Skull Base To Thigh (F-18 FDG)  Result Date: 07/24/2022 CLINICAL DATA:  Subsequent treatment strategy for papillary thyroid cancer. EXAM: NUCLEAR MEDICINE PET SKULL BASE TO THIGH TECHNIQUE: 12.0 mCi F-18 FDG was injected intravenously. Full-ring PET imaging was performed from the skull base to thigh after the radiotracer. CT data was obtained and used for attenuation correction and anatomic localization. Fasting blood glucose: 125 mg/dl COMPARISON:  CT neck and chest 06/02/2022 FINDINGS: Mediastinal blood pool activity: SUV max 3.75 Liver activity: SUV max N/A NECK: There is a small tracer avid left level 2 lymph node which measures 0.6 cm with SUV max of 4.06, image 31/4. Incidental CT findings: None. CHEST: High left paratracheal lymph node measures 1.3 cm with SUV max 12.43, image 57/4. Adjacent lymph node measures 1 cm and has an SUV max of 8.77, image 55/4. Pre-vascular lymph node along the midline of the superior mediastinum measures 6 mm with SUV max of 4.68, image 62/4. Nodule within the left lung base just above the left hemidiaphragm measures 9 mm with SUV max of 0.98, image 52/7.  Incidental CT findings: Aortic atherosclerosis and coronary artery calcifications. Cardiac enlargement. No pericardial effusion. ABDOMEN/PELVIS: No abnormal hypermetabolic activity within the liver, pancreas, adrenal glands, or spleen. No hypermetabolic lymph nodes in the abdomen or pelvis. Incidental CT findings: Small hiatal hernia. Status post prostatectomy and pelvic lymph node dissection. Sigmoid diverticulosis without signs of acute diverticulitis. SKELETON: No focal hypermetabolic activity to suggest skeletal metastasis. Incidental CT findings: None. IMPRESSION: 1. Tracer avid lymph nodes are identified within the left level 2 cervical node station, left paratracheal, and superior mediastinum compatible with nodal metastasis. 2. No signs of tracer avid metastatic disease to the abdomen or pelvis or bony skeleton. 3. 9 mm left lower lobe lung nodule is identified without significant tracer uptake. This is favored to represent a benign abnormality. 4. Coronary artery calcifications. 5. Status post prostatectomy and pelvic lymph node dissection. 6.  Aortic Atherosclerosis (ICD10-I70.0). Electronically Signed   By: Kerby Moors M.D.   On: 07/24/2022 15:55    Labs:  CBC: No results for input(s): "WBC", "HGB", "HCT", "PLT" in the last 8760 hours.  COAGS: No results for input(s): "INR", "APTT" in the last 8760 hours.  BMP: No results for input(s): "NA", "K", "CL", "CO2", "GLUCOSE", "BUN", "CALCIUM", "CREATININE", "GFRNONAA", "GFRAA" in the last 8760 hours.  Invalid input(s): "CMP"  LIVER FUNCTION TESTS: No results for input(s): "BILITOT", "AST", "ALT", "ALKPHOS", "PROT", "ALBUMIN" in the last 8760 hours.  TUMOR MARKERS: No results for input(s): "AFPTM", "CEA", "CA199", "CHROMGRNA" in the last 8760 hours.  Assessment and Plan:  Hx thyroid papillary cancer PET + cervical lymphadenopathy Scheduled foe for biopsy of cervical LN Risks and benefits of cervical lymph node biopsy was discussed  with the patient and/or patient's family including, but not limited to bleeding, infection, damage to adjacent structures or low yield requiring additional tests.  All of the questions were answered and there is agreement to proceed.  Consent signed and in chart.  Thank you for this interesting consult.  I greatly enjoyed meeting [name] and look forward to  participating in their care.  A copy of this report was sent to the requesting provider on this date.  Electronically Signed: Lavonia Drafts, PA-C 07/29/2022, 11:36 AM   I spent a total of  30 Minutes   in face to face in clinical consultation, greater than 50% of which was counseling/coordinating care for cervical LN Bx

## 2022-07-30 LAB — SURGICAL PATHOLOGY

## 2022-08-05 DIAGNOSIS — Z8546 Personal history of malignant neoplasm of prostate: Secondary | ICD-10-CM | POA: Diagnosis not present

## 2022-08-05 DIAGNOSIS — D3002 Benign neoplasm of left kidney: Secondary | ICD-10-CM | POA: Diagnosis not present

## 2022-08-13 DIAGNOSIS — C770 Secondary and unspecified malignant neoplasm of lymph nodes of head, face and neck: Secondary | ICD-10-CM | POA: Diagnosis not present

## 2022-08-13 DIAGNOSIS — C73 Malignant neoplasm of thyroid gland: Secondary | ICD-10-CM | POA: Diagnosis not present

## 2022-08-25 ENCOUNTER — Other Ambulatory Visit: Payer: Self-pay | Admitting: Internal Medicine

## 2022-08-25 DIAGNOSIS — C73 Malignant neoplasm of thyroid gland: Secondary | ICD-10-CM

## 2022-08-26 ENCOUNTER — Ambulatory Visit
Admission: RE | Admit: 2022-08-26 | Discharge: 2022-08-26 | Disposition: A | Discharge status: Home / Self Care | Payer: Medicare PPO | Source: Ambulatory Visit | Attending: Internal Medicine | Admitting: Internal Medicine

## 2022-08-26 DIAGNOSIS — C73 Malignant neoplasm of thyroid gland: Secondary | ICD-10-CM | POA: Diagnosis not present

## 2022-08-26 HISTORY — PX: IR RADIOLOGIST EVAL & MGMT: IMG5224

## 2022-08-26 NOTE — Consult Note (Signed)
Chief Complaint: Patient was seen in virtual telephone consultation today for metastatic papillary thyroid cancer  Referring Physician(s): Delrae Rend, MD  History of Present Illness: [name] is a 82 y.o. 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 Izora Gala in 2018.  For persistently elevated thyroglobulin and local recurrence, he is also status post radioactive iodine therapy in March 2019 and again in February 2023.  CT neck in October 2023 demonstrated slight enlargement of left cervical lymph nodes, which were confirmed hypermetabolic on PET/CT in early December.  He underwent core biopsy of one of the nodules on 07/29/22 which showed metastatic papillary thyroid carcinoma.  Per evaluation with Drs. Buddy Duty and Constance Holster, there are no other ideal treatment options for [name] with the exception of percutaneous ablation, for which he presents to discuss today.  Today he reports feeling well with no real symptoms. No dysphagia or shortness of breath.  No neck pain.      Past Medical History:  Diagnosis Date   Arthritis    "all over" (03/23/2018)   Chronic lower back pain    Curvature of spine    High cholesterol    Hypertension    Hypothyroidism    Kidney cysts 02/10/2017   bilateral   OSA on CPAP    PONV (postoperative nausea and vomiting)    "just w/back OR in ~ 1993"   Prostate CA ([place]) 2001   Thyroid cancer ([place]) 02/10/2017    Past Surgical History:  Procedure Laterality Date   BACK SURGERY     CARDIAC CATHETERIZATION  2002   HEMATOMA EVACUATION N/A 02/13/2017   Procedure: EVACUATION HEMATOMA, NECK;  Surgeon: Melida Quitter, MD;  [facility];  Service: ENT;  Laterality: N/A;   [place]   "herniated"   PROSTATECTOMY  2001   RADICAL NECK DISSECTION Left 02/12/2017   Procedure: RADICAL NECK  DISSECTION;  Surgeon: Izora Gala, MD;  Location: [place];  Service: ENT;  Laterality: Left;  Left modified neck dissection levels 1 through 4   THYROIDECTOMY N/A 02/12/2017   Procedure: THYROIDECTOMY;  Surgeon: Izora Gala, MD;  Location: [place];  Service: ENT;  Laterality: N/A;  total   TONSILLECTOMY     TOTAL KNEE ARTHROPLASTY Left 03/23/2018   TOTAL KNEE ARTHROPLASTY Left 03/23/2018   Procedure: TOTAL KNEE ARTHROPLASTY;  Surgeon: Frederik Pear, MD;  Location: [place];  Service: Orthopedics;  Laterality: Left;   TOTAL KNEE ARTHROPLASTY Right 03/06/2019   Procedure: Right Knee Arthroplasty;  Surgeon: Frederik Pear, MD;  Location: [facility];  Service: Orthopedics;  Laterality: Right;   UMBILICAL HERNIA REPAIR      Allergies: Codeine  Medications: Prior to Admission medications   Medication Sig Start Date End Date Taking? Authorizing Provider  aspirin EC 81 MG tablet Take 1 tablet (81 mg total) by mouth 2 (two) times daily. 03/06/19   Leighton Parody, PA-C  Calcium Carbonate-Vitamin D (CALCIUM 600/VITAMIN D PO) Take 1 tablet by mouth daily.    [provider]  cetirizine (ZYRTEC) 10 MG tablet Take 10 mg by mouth daily.    [provider]  Cholecalciferol (VITAMIN D-3 PO) Take 250 mcg by mouth daily.     [provider]  Coenzyme Q10 (COQ10) 100 MG CAPS Take 100 mg by mouth daily.     [provider]  ILEVRO 0.3 % ophthalmic suspension  01/31/20   [provider]  levothyroxine (SYNTHROID, LEVOTHROID) 175 MCG tablet Take 175 mcg by mouth daily before breakfast.    [provider]  lisinopril (PRINIVIL,ZESTRIL) 20 MG tablet Take 20 mg by mouth daily.    [provider]  Multiple Vitamins-Minerals (ONE-A-DAY MENS 50+ ADVANTAGE) TABS Take 1 tablet by mouth daily.    [provider]  ofloxacin (OCUFLOX) 0.3 % ophthalmic solution  02/01/20   [provider]  Omega-3 Fatty Acids (FISH OIL ULTRA) 1400 MG CAPS Take 1,400 mg by  mouth daily.     [provider]  oxyCODONE-acetaminophen (PERCOCET/ROXICET) 5-325 MG tablet Take 1 tablet by mouth every 4 (four) hours as needed for severe pain. 03/06/19   Leighton Parody, PA-C  PROLENSA 0.07 % SOLN  04/10/20   [provider]  simvastatin (ZOCOR) 40 MG tablet Take 40 mg by mouth daily at 6 PM.    [provider]  timolol (BETIMOL) 0.5 % ophthalmic solution Place 1 drop into both eyes daily.     [provider]  timolol (TIMOPTIC) 0.5 % ophthalmic solution  03/18/20   [provider]  tiZANidine (ZANAFLEX) 2 MG tablet Take 1 tablet (2 mg total) by mouth every 6 (six) hours as needed. 03/06/19   Leighton Parody, PA-C  Turmeric 500 MG CAPS Take 500 mg by mouth 2 (two) times daily.     [provider]     Family History  Problem Relation Age of Onset   Lymphoma Mother    Pancreatic cancer Father     Social History   Socioeconomic History   Marital status: Married    Spouse name: Not on file   Number of children: Not on file   Years of education: Not on file   Highest education level: Not on file  Occupational History   Not on file  Tobacco Use   Smoking status: Former    Types: Pipe    Quit date: 22    Years since quitting: 51.0   Smokeless tobacco: Never  Vaping Use   Vaping Use: Never used  Substance and Sexual Activity   Alcohol use: Yes    Comment: rare   Drug use: Never   Sexual activity: Not on file  Other Topics Concern   Not on file  Social History Narrative   Not on file   Social Determinants of Health   Financial Resource Strain: Not on file  Food Insecurity: Not on file  Transportation Needs: Not on file  Physical Activity: Not on file  Stress: Not on file  Social Connections: Not on file    ECOG Status: 0 - Asymptomatic  Review of Systems: A 12 point ROS discussed and pertinent positives are indicated in the HPI above.  All other systems are negative.   Vital Signs: There  were no vitals taken for this visit.  No physical examination was performed in lieu of virtual telephone clinic visit.  Imaging: PET 07/24/22   [place] 07/29/22    Labs:  CBC: No results for input(s): "WBC", "HGB", "HCT", "PLT" in the last 8760 hours.  COAGS: No results for input(s): "INR", "APTT" in the last 8760 hours.  BMP: No results for input(s): "NA", "K", "CL", "CO2", "GLUCOSE", "BUN", "CALCIUM", "CREATININE", "GFRNONAA", "GFRAA" in the last 8760 hours.  Invalid input(s): "CMP"  LIVER FUNCTION TESTS: No results for input(s): "BILITOT", "AST", "ALT", "ALKPHOS", "PROT", "ALBUMIN" in the last 8760  hours.  TUMOR MARKERS: No results for input(s): "AFPTM", "CEA", "CA199", "CHROMGRNA" in the last 8760 hours.  Pathology: 07/29/22 FINAL MICROSCOPIC DIAGNOSIS:   A. LYMPH NODE, LEFT NECK, NEEDLE CORE BIOPSY:  - Metastatic papillary thyroid carcinoma     Assessment and Plan: 82 year old male with history of recurrent, metastatic papillary thyroid carcinoma to 2 left inferior cervical lymph nodes.  He is a poor candidate for re-operation given prior thyroidectomy and left neck dissection. He has undergone radioactive iodine ablation x2.    We discussed percutaneous approaches to treating metastatic cervical lymph nodes including RF ablation and ethanol ablation.  The location of the nodes is too close to surrounding vasculature to attempt RF ablation.  He should be a good candidate for ethanol ablation.    We discussed risks and benefits of ethanol ablation to most commonly include discomfort/pain, followed by less commonly voice change, infection, brachial plexus injury, hematoma, vomiting, and skin changes. He is amenable to proceed.  Plan for ultrasound guided left cervical lymph node ethanol ablation (two separate nodules) at [facility] with moderate sedation.    Electronically Signed: Suzette Battiest, MD 08/26/2022, 1:59 PM   I spent a total of  30 Minutes  in  virtual telephone clinical consultation, greater than 50% of which was counseling/coordinating care for metastatic thyroid cancer.

## 2022-08-27 ENCOUNTER — Other Ambulatory Visit (HOSPITAL_COMMUNITY): Payer: Self-pay | Admitting: Interventional Radiology

## 2022-08-27 DIAGNOSIS — C73 Malignant neoplasm of thyroid gland: Secondary | ICD-10-CM

## 2022-09-08 DIAGNOSIS — R051 Acute cough: Secondary | ICD-10-CM | POA: Diagnosis not present

## 2022-09-10 DIAGNOSIS — G4733 Obstructive sleep apnea (adult) (pediatric): Secondary | ICD-10-CM | POA: Diagnosis not present

## 2022-09-17 DIAGNOSIS — H401132 Primary open-angle glaucoma, bilateral, moderate stage: Secondary | ICD-10-CM | POA: Diagnosis not present

## 2022-09-17 DIAGNOSIS — H43813 Vitreous degeneration, bilateral: Secondary | ICD-10-CM | POA: Diagnosis not present

## 2022-09-18 ENCOUNTER — Other Ambulatory Visit: Payer: Self-pay | Admitting: Internal Medicine

## 2022-09-18 ENCOUNTER — Ambulatory Visit
Admission: RE | Admit: 2022-09-18 | Discharge: 2022-09-18 | Disposition: A | Discharge status: Home / Self Care | Payer: Medicare PPO | Source: Ambulatory Visit | Attending: Internal Medicine | Admitting: Internal Medicine

## 2022-09-18 DIAGNOSIS — R058 Other specified cough: Secondary | ICD-10-CM | POA: Diagnosis not present

## 2022-09-18 DIAGNOSIS — R059 Cough, unspecified: Secondary | ICD-10-CM | POA: Diagnosis not present

## 2022-09-21 ENCOUNTER — Other Ambulatory Visit: Payer: Self-pay | Admitting: Radiology

## 2022-09-21 DIAGNOSIS — C73 Malignant neoplasm of thyroid gland: Secondary | ICD-10-CM

## 2022-09-21 NOTE — Progress Notes (Signed)
Referring Physician(s): Kerr,J  Supervising Physician: Ruthann Cancer  Patient Status:  [facility]  Chief Complaint: Metastatic thyroid cancer   Subjective: Pt known to IR team from left renal mass bx in 2017, left thyroid nodule/cervical LN bx in 2018, left lower cervical LN bx on 07/29/22. 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  He is a poor candidate for re-operation given prior thyroidectomy 2018 and left neck dissection. He has undergone radioactive iodine ablation x2. He underwent consultation with Dr. Serafina Royals on 08/26/22 to discuss percutaneous approaches to treating metastatic cervical lymph nodes including RF ablation and ethanol ablation.  The location of the nodes is too close to surrounding vasculature to attempt RF ablation.  He was deemed an appropriate candidate for ethanol ablation of the 2 involved left cervical lymph nodes and presents today for the procedure. He currently denies fever, headache, chest pain, dyspnea, abdominal/back pain, nausea, vomiting or bleeding.  He does have some occasional sinus drainage and cough.  Additional medical history as below.    Past Medical History:  Diagnosis Date   Arthritis    "all over" (03/23/2018)   Chronic lower back pain    Curvature of spine    High cholesterol    Hypertension    Hypothyroidism    Kidney cysts 02/10/2017   bilateral   OSA on CPAP    PONV (postoperative nausea and vomiting)    "just w/back OR in ~ 1993"   Prostate CA ([place]) 2001   Thyroid cancer ([place]) 02/10/2017   Past Surgical History:  Procedure Laterality Date   BACK SURGERY     CARDIAC CATHETERIZATION  2002   HEMATOMA EVACUATION N/A 02/13/2017   Procedure: EVACUATION HEMATOMA, NECK;  Surgeon: Melida Quitter, MD;  Location: [place];  Service: ENT;  Laterality: N/A;   HERNIA REPAIR     IR RADIOLOGIST EVAL & MGMT  08/26/2022   JOINT REPLACEMENT     LUMBAR [place]   "herniated"   PROSTATECTOMY  2001   RADICAL NECK DISSECTION Left 02/12/2017   Procedure: RADICAL NECK DISSECTION;  Surgeon: Izora Gala, MD;  Location: [place];  Service: ENT;  Laterality: Left;  Left modified neck dissection levels 1 through 4   THYROIDECTOMY N/A 02/12/2017   Procedure: THYROIDECTOMY;  Surgeon: Izora Gala, MD;  Location: [place];  Service: ENT;  Laterality: N/A;  total   TONSILLECTOMY     TOTAL KNEE ARTHROPLASTY Left 03/23/2018   TOTAL KNEE ARTHROPLASTY Left 03/23/2018   Procedure: TOTAL KNEE ARTHROPLASTY;  Surgeon: Frederik Pear, MD;  Location: [place];  Service: Orthopedics;  Laterality: Left;   TOTAL KNEE ARTHROPLASTY Right 03/06/2019   Procedure: Right Knee Arthroplasty;  Surgeon: Frederik Pear, MD;  [facility];  Service: Orthopedics;  Laterality: Right;   UMBILICAL HERNIA REPAIR        Allergies: Codeine  Medications: Prior to Admission medications   Medication Sig Start Date End Date Taking? Authorizing Provider  aspirin EC 81 MG tablet Take 1 tablet (81 mg total) by mouth 2 (two) times daily. 03/06/19   Leighton Parody, PA-C  Calcium Carbonate-Vitamin D (CALCIUM 600/VITAMIN D PO) Take 1 tablet by mouth daily.    [provider]  cetirizine (ZYRTEC) 10 MG tablet Take 10 mg by mouth daily.    [provider]  Cholecalciferol (VITAMIN D-3 PO) Take 250 mcg by mouth daily.  [provider]  Coenzyme Q10 (COQ10) 100 MG CAPS Take 100 mg by mouth daily.     [provider]  ILEVRO 0.3 % ophthalmic suspension  01/31/20   [provider]  levothyroxine (SYNTHROID, LEVOTHROID) 175 MCG tablet Take 175 mcg by mouth daily before breakfast.    [provider]  lisinopril (PRINIVIL,ZESTRIL) 20 MG tablet Take 20 mg by mouth daily.    [provider]  Multiple Vitamins-Minerals (ONE-A-DAY MENS 50+ ADVANTAGE) TABS Take 1 tablet by mouth daily.    [provider]  ofloxacin (OCUFLOX) 0.3 %  ophthalmic solution  02/01/20   [provider]  Omega-3 Fatty Acids (FISH OIL ULTRA) 1400 MG CAPS Take 1,400 mg by mouth daily.     [provider]  oxyCODONE-acetaminophen (PERCOCET/ROXICET) 5-325 MG tablet Take 1 tablet by mouth every 4 (four) hours as needed for severe pain. 03/06/19   Leighton Parody, PA-C  PROLENSA 0.07 % SOLN  04/10/20   [provider]  simvastatin (ZOCOR) 40 MG tablet Take 40 mg by mouth daily at 6 PM.    [provider]  timolol (BETIMOL) 0.5 % ophthalmic solution Place 1 drop into both eyes daily.     [provider]  timolol (TIMOPTIC) 0.5 % ophthalmic solution  03/18/20   [provider]  tiZANidine (ZANAFLEX) 2 MG tablet Take 1 tablet (2 mg total) by mouth every 6 (six) hours as needed. 03/06/19   Leighton Parody, PA-C  Turmeric 500 MG CAPS Take 500 mg by mouth 2 (two) times daily.     [provider]     Vital Signs:pending    Physical Exam awake, alert.  Chest clear to auscultation bilaterally.  Heart with regular rate and rhythm.  Abdomen obese,soft, positive bowel sounds, nontender.  Trace pretibial edema bilaterally.  Imaging: DG Chest 2 View  Result Date: 09/19/2022 CLINICAL DATA:  Productive cough EXAM: CHEST - 2 VIEW COMPARISON:  March 15, 2018 FINDINGS: The heart size and mediastinal contours are stable. Both lungs are clear. The visualized skeletal structures are stable. IMPRESSION: No active cardiopulmonary disease. Electronically Signed   By: Abelardo Diesel M.D.   On: 09/19/2022 07:56    Labs:  CBC: No results for input(s): "WBC", "HGB", "HCT", "PLT" in the last 8760 hours.  COAGS: No results for input(s): "INR", "APTT" in the last 8760 hours.  BMP: No results for input(s): "NA", "K", "CL", "CO2", "GLUCOSE", "BUN", "CALCIUM", "CREATININE", "GFRNONAA", "GFRAA" in the last 8760 hours.  Invalid input(s): "CMP"  LIVER FUNCTION TESTS: No results for input(s): "BILITOT", "AST", "ALT",  "ALKPHOS", "PROT", "ALBUMIN" in the last 8760 hours.  Assessment and Plan: Pt known to IR team from left renal mass bx in 2017, left thyroid nodule/cervical LN bx in 2018, left lower cervical LN bx on 07/29/22. 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  He is a poor candidate for re-operation given prior thyroidectomy 2018 and left neck dissection. He has undergone radioactive iodine ablation x2. He underwent consultation with Dr. Serafina Royals on 08/26/22 to discuss percutaneous approaches to treating metastatic cervical lymph nodes including RF ablation and ethanol ablation.  The location of the nodes is too close to surrounding vasculature to attempt RF ablation.  He was deemed an appropriate candidate for ethanol ablation of the 2 involved left cervical lymph nodes and presents  today for the procedure. Risks and benefits of procedure was discussed with the patient including, but not limited to bleeding, infection, damage to adjacent structures , voice change, brachial plexus injury,  vomiting, and skin changes .  All of the questions were answered and there is agreement to proceed.  Consent signed and in chart.    Electronically Signed: D. Rowe Robert, PA-C 09/21/2022, 9:04 PM   I spent a total of 20 minutes at the the patient's bedside AND on the patient's hospital floor or unit, greater than 50% of which was counseling/coordinating care for US guided ethanol ablation of left inferior cervical lymph nodes

## 2022-09-22 ENCOUNTER — Other Ambulatory Visit: Payer: Self-pay | Admitting: Radiology

## 2022-09-22 ENCOUNTER — Ambulatory Visit (HOSPITAL_COMMUNITY)
Admission: RE | Admit: 2022-09-22 | Discharge: 2022-09-22 | Disposition: A | Discharge status: Home / Self Care | Payer: Medicare PPO | Source: Ambulatory Visit | Attending: Interventional Radiology | Admitting: Interventional Radiology

## 2022-09-22 ENCOUNTER — Encounter (HOSPITAL_COMMUNITY): Payer: Self-pay

## 2022-09-22 DIAGNOSIS — C770 Secondary and unspecified malignant neoplasm of lymph nodes of head, face and neck: Secondary | ICD-10-CM | POA: Diagnosis not present

## 2022-09-22 DIAGNOSIS — C73 Malignant neoplasm of thyroid gland: Secondary | ICD-10-CM | POA: Diagnosis not present

## 2022-09-22 DIAGNOSIS — Z7982 Long term (current) use of aspirin: Secondary | ICD-10-CM | POA: Insufficient documentation

## 2022-09-22 LAB — CBC
HCT: 43.6 % (ref 39.0–52.0)
Hemoglobin: 15.1 g/dL (ref 13.0–17.0)
MCH: 32 pg (ref 26.0–34.0)
MCHC: 34.6 g/dL (ref 30.0–36.0)
MCV: 92.4 fL (ref 80.0–100.0)
Platelets: 188 10*3/uL (ref 150–400)
RBC: 4.72 MIL/uL (ref 4.22–5.81)
RDW: 13.3 % (ref 11.5–15.5)
WBC: 8.5 10*3/uL (ref 4.0–10.5)
nRBC: 0 % (ref 0.0–0.2)

## 2022-09-22 LAB — PROTIME-INR
INR: 1.1 (ref 0.8–1.2)
Prothrombin Time: 14.4 seconds (ref 11.4–15.2)

## 2022-09-22 MED ORDER — FENTANYL CITRATE (PF) 100 MCG/2ML IJ SOLN
INTRAMUSCULAR | Status: AC | PRN
  Administered 2022-09-22: 50 ug via INTRAVENOUS

## 2022-09-22 MED ORDER — MIDAZOLAM HCL 2 MG/2ML IJ SOLN
INTRAMUSCULAR | Status: AC
  Filled 2022-09-22: qty 2

## 2022-09-22 MED ORDER — ETHYL ALCOHOL 100 % SOLN: 5.0000 mL | Freq: Once | 0 refills | Status: DC

## 2022-09-22 MED ORDER — ETHYL ALCOHOL 100 % SOLN: Status: DC

## 2022-09-22 MED ORDER — ALCOHOL 98 % IJ SOLN
INTRAMUSCULAR | Status: AC
  Administered 2022-09-22: 1.5 mL
  Filled 2022-09-22: qty 10

## 2022-09-22 MED ORDER — SODIUM CHLORIDE 0.9 % IV SOLN: INTRAVENOUS | Status: DC

## 2022-09-22 MED ORDER — LIDOCAINE HCL 1 % IJ SOLN
INTRAMUSCULAR | Status: AC
  Administered 2022-09-22: 10 mL
  Filled 2022-09-22: qty 20

## 2022-09-22 MED ORDER — LIDOCAINE HCL (PF) 1 % IJ SOLN
INTRAMUSCULAR | Status: AC | PRN
  Administered 2022-09-22: 10 mL

## 2022-09-22 MED ORDER — FENTANYL CITRATE (PF) 100 MCG/2ML IJ SOLN
INTRAMUSCULAR | Status: AC
  Filled 2022-09-22: qty 2

## 2022-09-22 MED ORDER — ALCOHOL 98 % IJ SOLN: 5.0000 mL | Freq: Once | INTRAMUSCULAR | Status: AC

## 2022-09-22 MED ORDER — MIDAZOLAM HCL 2 MG/2ML IJ SOLN
INTRAMUSCULAR | Status: AC | PRN
  Administered 2022-09-22: 1 mg via INTRAVENOUS

## 2022-09-22 NOTE — Procedures (Signed)
Interventional Radiology Procedure Note  Procedure:  Cervical lymph node ultrasound guided percutaneous ethanol injection x2  Findings: Please refer to procedural dictation for full description. Both level IV metatstatic lymph nodes injected.  Medial node treated with 0.6 cc EtOH, lateral node treated with 1.1 cc EtOH.  Complications: None  Estimated Blood Loss: < 5 mL  Recommendations: IR will follow up in clinic in 2-4 weeks. We will arrange for 3 month contrast enhanced ultrasound for follow up.   Ruthann Cancer, MD

## 2022-09-22 NOTE — Discharge Instructions (Signed)
For questions /concerns may call Interventional Radiology at [phone] or  Interventional Radiology clinic [phone]   You may remove your dressing and shower tomorrow afternoon  DO NOT use EMLA cream for 2 weeks after port placement as the cream will remove surgical glue on your incision.  Moderate Conscious Sedation, Adult, Care After This sheet gives you information about how to care for yourself after your procedure. Your health care provider may also give you more specific instructions. If you have problems or questions, contact your health careprovider. What can I expect after the procedure? After the procedure, it is common to have: Sleepiness for several hours. Impaired judgment for several hours. Difficulty with balance. Vomiting if you eat too soon. Follow these instructions at home: For the time period you were told by your health care provider: Rest. Do not participate in activities where you could fall or become injured. Do not drive or use machinery. Do not drink alcohol. Do not take sleeping pills or medicines that cause drowsiness. Do not make important decisions or sign legal documents. Do not take care of children on your own. Eating and drinking  Follow the diet recommended by your health care provider. Drink enough fluid to keep your urine pale yellow. If you vomit: Drink water, juice, or soup when you can drink without vomiting. Make sure you have little or no nausea before eating solid foods.  General instructions Take over-the-counter and prescription medicines only as told by your health care provider. Have a responsible adult stay with you for the time you are told. It is important to have someone help care for you until you are awake and alert. Do not smoke. Keep all follow-up visits as told by your health care provider. This is important. Contact a health care provider if: You are still sleepy or having trouble with balance after 24 hours. You feel  light-headed. You keep feeling nauseous or you keep vomiting. You develop a rash. You have a fever. You have redness or swelling around the IV site. Get help right away if: You have trouble breathing. You have new-onset confusion at home. Summary After the procedure, it is common to feel sleepy, have impaired judgment, or feel nauseous if you eat too soon. Rest after you get home. Know the things you should not do after the procedure. Follow the diet recommended by your health care provider and drink enough fluid to keep your urine pale yellow. Get help right away if you have trouble breathing or new-onset confusion at home. This information is not intended to replace advice given to you by your health care provider. Make sure you discuss any questions you have with your healthcare provider. Document Revised: 12/08/2019 Document Reviewed: 07/06/2019 Elsevier Patient Education  2022 [employer].

## 2022-10-14 DIAGNOSIS — Z0000 Encounter for general adult medical examination without abnormal findings: Secondary | ICD-10-CM | POA: Diagnosis not present

## 2022-10-14 DIAGNOSIS — Z8546 Personal history of malignant neoplasm of prostate: Secondary | ICD-10-CM | POA: Diagnosis not present

## 2022-10-14 DIAGNOSIS — M5136 Other intervertebral disc degeneration, lumbar region: Secondary | ICD-10-CM | POA: Diagnosis not present

## 2022-10-14 DIAGNOSIS — Z23 Encounter for immunization: Secondary | ICD-10-CM | POA: Diagnosis not present

## 2022-10-14 DIAGNOSIS — D369 Benign neoplasm, unspecified site: Secondary | ICD-10-CM | POA: Diagnosis not present

## 2022-10-14 DIAGNOSIS — I700 Atherosclerosis of aorta: Secondary | ICD-10-CM | POA: Diagnosis not present

## 2022-10-14 DIAGNOSIS — I10 Essential (primary) hypertension: Secondary | ICD-10-CM | POA: Diagnosis not present

## 2022-10-14 DIAGNOSIS — E890 Postprocedural hypothyroidism: Secondary | ICD-10-CM | POA: Diagnosis not present

## 2022-10-14 DIAGNOSIS — I2510 Atherosclerotic heart disease of native coronary artery without angina pectoris: Secondary | ICD-10-CM | POA: Diagnosis not present

## 2022-10-14 DIAGNOSIS — G4733 Obstructive sleep apnea (adult) (pediatric): Secondary | ICD-10-CM | POA: Diagnosis not present

## 2022-10-14 DIAGNOSIS — R7303 Prediabetes: Secondary | ICD-10-CM | POA: Diagnosis not present

## 2022-10-14 DIAGNOSIS — C73 Malignant neoplasm of thyroid gland: Secondary | ICD-10-CM | POA: Diagnosis not present

## 2022-10-14 DIAGNOSIS — Z1331 Encounter for screening for depression: Secondary | ICD-10-CM | POA: Diagnosis not present

## 2022-10-19 ENCOUNTER — Ambulatory Visit
Admission: RE | Admit: 2022-10-19 | Discharge: 2022-10-19 | Disposition: A | Discharge status: Home / Self Care | Payer: Medicare PPO | Source: Ambulatory Visit | Attending: Internal Medicine | Admitting: Internal Medicine

## 2022-10-19 DIAGNOSIS — C73 Malignant neoplasm of thyroid gland: Secondary | ICD-10-CM

## 2022-10-19 HISTORY — PX: IR RADIOLOGIST EVAL & MGMT: IMG5224

## 2022-10-19 NOTE — Progress Notes (Signed)
Reason for follow up:  Patient was seen in virtual telephone visit today for follow up after left metastatic cervical lymph node percutaneous ethanol injection  Referring Physician(s): Kerr,Jeffrey  History of present illness: Initial HPI from 08/26/22:  [name] is a 82 y.o. 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 Izora Gala in 2018.  For persistently elevated thyroglobulin and local recurrence, he is also status post radioactive iodine therapy in March 2019 and again in February 2023.  CT neck in October 2023 demonstrated slight enlargement of left cervical lymph nodes, which were confirmed hypermetabolic on PET/CT in early December.  He underwent core biopsy of one of the nodules on 07/29/22 which showed metastatic papillary thyroid carcinoma.   Per evaluation with Drs. Buddy Duty and Constance Holster, there are no other ideal treatment options for [name] with the exception of percutaneous ablation, for which he presents to discuss today.   Today he reports feeling well with no real symptoms. No dysphagia or shortness of breath.  No neck pain.   Ultrasound-guided ethanol injection into the two biopsy proven metastatic left level 4 lymph nodes was technically successful and without complication.  He reports doing very well after the procedure. He denies any pain, skin changes, or new symptoms.  Past Medical History:  Diagnosis Date   Arthritis    "all over" (03/23/2018)   Chronic lower back pain    Curvature of spine    High cholesterol    Hypertension    Hypothyroidism    Kidney cysts 02/10/2017   bilateral   OSA on CPAP    PONV (postoperative nausea and vomiting)    "just w/back OR in ~ 1993"   Prostate CA ([place]) 2001   Thyroid cancer ([place]) 02/10/2017    Past Surgical History:  Procedure Laterality Date   BACK SURGERY     CARDIAC CATHETERIZATION  2002   HEMATOMA EVACUATION N/A  02/13/2017   Procedure: EVACUATION HEMATOMA, NECK;  Surgeon: Melida Quitter, MD;  Location: [place];  Service: ENT;  Laterality: N/A;   HERNIA REPAIR     IR RADIOLOGIST EVAL & MGMT  08/26/2022   JOINT REPLACEMENT     LUMBAR [place]   "herniated"   PROSTATECTOMY  2001   RADICAL NECK DISSECTION Left 02/12/2017   Procedure: RADICAL NECK DISSECTION;  Surgeon: Izora Gala, MD;  Location: [place];  Service: ENT;  Laterality: Left;  Left modified neck dissection levels 1 through 4   THYROIDECTOMY N/A 02/12/2017   Procedure: THYROIDECTOMY;  Surgeon: Izora Gala, MD;  Location: [place];  Service: ENT;  Laterality: N/A;  total   TONSILLECTOMY     TOTAL KNEE ARTHROPLASTY Left 03/23/2018   TOTAL KNEE ARTHROPLASTY Left 03/23/2018   Procedure: TOTAL KNEE ARTHROPLASTY;  Surgeon: Frederik Pear, MD;  Location: [place];  Service: Orthopedics;  Laterality: Left;   TOTAL KNEE ARTHROPLASTY Right 03/06/2019   Procedure: Right Knee Arthroplasty;  Surgeon: Frederik Pear, MD;  [facility];  Service: Orthopedics;  Laterality: Right;   UMBILICAL HERNIA REPAIR      Allergies: Codeine  Medications: Prior to Admission medications   Medication Sig Start Date End Date Taking? Authorizing Provider  aspirin EC 81 MG tablet Take 1 tablet (81 mg total) by mouth 2 (two) times daily. 03/06/19   Leighton Parody, PA-C  Calcium Carbonate-Vitamin D (CALCIUM 600/VITAMIN D PO) Take 1 tablet by mouth daily.  [provider]  cetirizine (ZYRTEC) 10 MG tablet Take 10 mg by mouth daily.    [provider]  Cholecalciferol (VITAMIN D-3 PO) Take 250 mcg by mouth daily.     [provider]  Coenzyme Q10 (COQ10) 100 MG CAPS Take 100 mg by mouth daily.     [provider]  ILEVRO 0.3 % ophthalmic suspension  01/31/20   [provider]  levothyroxine (SYNTHROID, LEVOTHROID) 175 MCG tablet Take 175 mcg by mouth daily before breakfast.    [provider]  lisinopril (PRINIVIL,ZESTRIL)  20 MG tablet Take 20 mg by mouth daily.    [provider]  Multiple Vitamins-Minerals (ONE-A-DAY MENS 50+ ADVANTAGE) TABS Take 1 tablet by mouth daily.    [provider]  ofloxacin (OCUFLOX) 0.3 % ophthalmic solution  02/01/20   [provider]  Omega-3 Fatty Acids (FISH OIL ULTRA) 1400 MG CAPS Take 1,400 mg by mouth daily.     [provider]  oxyCODONE-acetaminophen (PERCOCET/ROXICET) 5-325 MG tablet Take 1 tablet by mouth every 4 (four) hours as needed for severe pain. 03/06/19   Leighton Parody, PA-C  PROLENSA 0.07 % SOLN  04/10/20   [provider]  simvastatin (ZOCOR) 40 MG tablet Take 40 mg by mouth daily at 6 PM.    [provider]  timolol (BETIMOL) 0.5 % ophthalmic solution Place 1 drop into both eyes daily.     [provider]  timolol (TIMOPTIC) 0.5 % ophthalmic solution  03/18/20   [provider]  tiZANidine (ZANAFLEX) 2 MG tablet Take 1 tablet (2 mg total) by mouth every 6 (six) hours as needed. 03/06/19   Leighton Parody, PA-C  Turmeric 500 MG CAPS Take 500 mg by mouth 2 (two) times daily.     [provider]  valsartan-hydrochlorothiazide (DIOVAN-HCT) 80-12.5 MG tablet Take 1 tablet by mouth daily.    [provider]     Family History  Problem Relation Age of Onset   Lymphoma Mother    Pancreatic cancer Father     Social History   Socioeconomic History   Marital status: Married    Spouse name: Not on file   Number of children: Not on file   Years of education: Not on file   Highest education level: Not on file  Occupational History   Not on file  Tobacco Use   Smoking status: Former    Types: Pipe    Quit date: 45    Years since quitting: 51.1   Smokeless tobacco: Never  Vaping Use   Vaping Use: Never used  Substance and Sexual Activity   Alcohol use: Yes    Comment: rare   Drug use: Never   Sexual activity: Not on file  Other Topics Concern   Not on file  Social  History Narrative   Not on file   Social Determinants of Health   Financial Resource Strain: Not on file  Food Insecurity: Not on file  Transportation Needs: Not on file  Physical Activity: Not on file  Stress: Not on file  Social Connections: Not on file     Vital Signs: There were no vitals taken for this visit.  No physical examination was performed in lieu of virtual telephone clinic visit.   Imaging: PET 07/24/22    [place] 07/29/22   Labs:  CBC: Recent Labs    09/22/22 1100  WBC 8.5  HGB 15.1  HCT 43.6  PLT 188  COAGS: Recent Labs    09/22/22 1100  INR 1.1    BMP: No results for input(s): "NA", "K", "CL", "CO2", "GLUCOSE", "BUN", "CALCIUM", "CREATININE", "GFRNONAA", "GFRAA" in the last 8760 hours.  Invalid input(s): "CMP"  LIVER FUNCTION TESTS: No results for input(s): "BILITOT", "AST", "ALT", "ALKPHOS", "PROT", "ALBUMIN" in the last 8760 hours.  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 He is a poor candidate for re-operation given prior thyroidectomy and left neck dissection. He has undergone radioactive iodine ablation x2.   He is now status post ultrasound guided percutaneous ethanol injection into the two prominent left left 4 lymph nodes on 09/22/22.  He is doing very well since the procedure without symptoms.  Plan for contrast-enhanced ultrasound of the treated lymph nodes in 2 months to assess viability (please arrange to be performed at [facility] with me).  Electronically Signed: Suzette Battiest 10/19/2022, 8:37 AM   I spent a total of 25 Minutes in virtual telephone clinical consultation, greater than 50% of which was counseling/coordinating care for metastatic thyroid cancer.

## 2022-10-26 DIAGNOSIS — C770 Secondary and unspecified malignant neoplasm of lymph nodes of head, face and neck: Secondary | ICD-10-CM | POA: Diagnosis not present

## 2022-10-26 DIAGNOSIS — C73 Malignant neoplasm of thyroid gland: Secondary | ICD-10-CM | POA: Diagnosis not present

## 2022-10-26 DIAGNOSIS — E890 Postprocedural hypothyroidism: Secondary | ICD-10-CM | POA: Diagnosis not present

## 2022-11-02 DIAGNOSIS — C73 Malignant neoplasm of thyroid gland: Secondary | ICD-10-CM | POA: Diagnosis not present

## 2022-11-02 DIAGNOSIS — E890 Postprocedural hypothyroidism: Secondary | ICD-10-CM | POA: Diagnosis not present

## 2022-11-02 DIAGNOSIS — C770 Secondary and unspecified malignant neoplasm of lymph nodes of head, face and neck: Secondary | ICD-10-CM | POA: Diagnosis not present

## 2022-11-30 ENCOUNTER — Other Ambulatory Visit (HOSPITAL_COMMUNITY): Payer: Self-pay | Admitting: Interventional Radiology

## 2022-11-30 DIAGNOSIS — C73 Malignant neoplasm of thyroid gland: Secondary | ICD-10-CM

## 2022-12-24 ENCOUNTER — Ambulatory Visit (HOSPITAL_COMMUNITY)
Admission: RE | Admit: 2022-12-24 | Discharge: 2022-12-24 | Disposition: A | Discharge status: Home / Self Care | Payer: Medicare PPO | Source: Ambulatory Visit | Attending: Interventional Radiology | Admitting: Interventional Radiology

## 2022-12-24 ENCOUNTER — Other Ambulatory Visit (HOSPITAL_COMMUNITY): Payer: Self-pay | Admitting: Interventional Radiology

## 2022-12-24 DIAGNOSIS — C73 Malignant neoplasm of thyroid gland: Secondary | ICD-10-CM

## 2022-12-24 DIAGNOSIS — E041 Nontoxic single thyroid nodule: Secondary | ICD-10-CM | POA: Diagnosis not present

## 2022-12-24 MED ORDER — SULFUR HEXAFLUORIDE MICROSPH 60.7-25 MG IJ SUSR
5.0000 mL | Freq: Once | INTRAMUSCULAR | Status: AC | PRN
  Administered 2022-12-24: 1.7 mL via INTRAVENOUS

## 2022-12-24 MED ORDER — SULFUR HEXAFLUORIDE MICROSPH 60.7-25 MG IJ SUSR
INTRAMUSCULAR | Status: AC
  Filled 2022-12-24: qty 5

## 2023-01-05 ENCOUNTER — Ambulatory Visit
Admission: RE | Admit: 2023-01-05 | Discharge: 2023-01-05 | Disposition: A | Discharge status: Home / Self Care | Payer: Medicare PPO | Source: Ambulatory Visit | Attending: Interventional Radiology | Admitting: Interventional Radiology

## 2023-01-05 DIAGNOSIS — C73 Malignant neoplasm of thyroid gland: Secondary | ICD-10-CM | POA: Diagnosis not present

## 2023-01-05 HISTORY — PX: IR RADIOLOGIST EVAL & MGMT: IMG5224

## 2023-01-05 NOTE — Progress Notes (Signed)
Reason for follow up:  Patient was seen in virtual telephone visit today for follow up after left metastatic cervical lymph node percutaneous ethanol injection   Referring Physician(s): Kerr,Jeffrey   History of present illness: Initial HPI from 08/26/22:  [name] is a 82 y.o. 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 Serena Colonel in 2018.  For persistently elevated thyroglobulin and local recurrence, he is also status post radioactive iodine therapy in March 2019 and again in February 2023.  CT neck in October 2023 demonstrated slight enlargement of left cervical lymph nodes, which were confirmed hypermetabolic on PET/CT in early December.  He underwent core biopsy of one of the nodules on 07/29/22 which showed metastatic papillary thyroid carcinoma.   Per evaluation with Drs. Sharl Ma and Pollyann Kennedy, there are no other ideal treatment options for [name] with the exception of percutaneous ablation, for which he presents to discuss today.   Today he reports feeling well with no real symptoms. No dysphagia or shortness of breath.  No neck pain.    Ultrasound-guided ethanol injection into the two biopsy proven metastatic left level 4 lymph nodes was technically successful and without complication.  He continues to report doing very well after the procedure. He denies any pain, skin changes, or new symptoms.  He presented at [facility] on 12/24/22 for contrast enhanced ultrasound evaluation of the treated metastatic lymph nodes to assess response.  Unfortunately, both lymph nodes showed persistent enhancement indicative of viability, however with 37% volume reduction of the medial node and 45% volume reduction of the lateral node.  I discussed these results with Dr. Sharl Ma at that time.  He in formed me that [name]'s thyroglobulin labs (not privy to me) have decreased considerably, which  suggests a favorable response to treatment.   We discussed the possibility of repeat percutaneous ethanol injection.   Past Medical History:  Diagnosis Date   Arthritis    "all over" (03/23/2018)   Chronic lower back pain    Curvature of spine    High cholesterol    Hypertension    Hypothyroidism    Kidney cysts 02/10/2017   bilateral   OSA on CPAP    PONV (postoperative nausea and vomiting)    "just w/back OR in ~ 1993"   Prostate CA (HCC) 2001   Thyroid cancer (HCC) 02/10/2017    Past Surgical History:  Procedure Laterality Date   BACK SURGERY     CARDIAC CATHETERIZATION  2002   HEMATOMA EVACUATION N/A 02/13/2017   Procedure: EVACUATION HEMATOMA, NECK;  Surgeon: Christia Reading, MD;  Location: [facility] OR;  Service: ENT;  Laterality: N/A;   HERNIA REPAIR     IR RADIOLOGIST EVAL & MGMT  08/26/2022   IR RADIOLOGIST EVAL & MGMT  10/19/2022   JOINT REPLACEMENT     LUMBAR DISC SURGERY  1993   "herniated"   PROSTATECTOMY  2001   RADICAL NECK DISSECTION Left 02/12/2017   Procedure: RADICAL NECK DISSECTION;  Surgeon: Serena Colonel, MD;  Location: [facility] OR;  Service: ENT;  Laterality: Left;  Left modified neck dissection levels 1 through 4   THYROIDECTOMY N/A 02/12/2017   Procedure: THYROIDECTOMY;  Surgeon: Serena Colonel, MD;  Location: [facility] OR;  Service: ENT;  Laterality: N/A;  total   TONSILLECTOMY     TOTAL KNEE ARTHROPLASTY Left 03/23/2018   TOTAL KNEE ARTHROPLASTY Left 03/23/2018   Procedure: TOTAL KNEE ARTHROPLASTY;  Surgeon: Gean Birchwood, MD;  Location: [facility] OR;  Service: Orthopedics;  Laterality: Left;   TOTAL KNEE ARTHROPLASTY Right 03/06/2019   Procedure: Right Knee Arthroplasty;  Surgeon: Gean Birchwood, MD;  Location: [facility] ORS;  Service: Orthopedics;  Laterality: Right;   UMBILICAL HERNIA REPAIR      Allergies: Codeine  Medications: Prior to Admission medications   Medication Sig Start Date End Date Taking? Authorizing Provider  aspirin EC 81 MG tablet Take 1 tablet (81 mg total) by mouth  2 (two) times daily. 03/06/19   Allena Katz, PA-C  Calcium Carbonate-Vitamin D (CALCIUM 600/VITAMIN D PO) Take 1 tablet by mouth daily.    [provider]  cetirizine (ZYRTEC) 10 MG tablet Take 10 mg by mouth daily.    [provider]  Cholecalciferol (VITAMIN D-3 PO) Take 250 mcg by mouth daily.     [provider]  Coenzyme Q10 (COQ10) 100 MG CAPS Take 100 mg by mouth daily.     [provider]  ILEVRO 0.3 % ophthalmic suspension  01/31/20   [provider]  levothyroxine (SYNTHROID, LEVOTHROID) 175 MCG tablet Take 175 mcg by mouth daily before breakfast.    [provider]  lisinopril (PRINIVIL,ZESTRIL) 20 MG tablet Take 20 mg by mouth daily.    [provider]  Multiple Vitamins-Minerals (ONE-A-DAY MENS 50+ ADVANTAGE) TABS Take 1 tablet by mouth daily.    [provider]  ofloxacin (OCUFLOX) 0.3 % ophthalmic solution  02/01/20   [provider]  Omega-3 Fatty Acids (FISH OIL ULTRA) 1400 MG CAPS Take 1,400 mg by mouth daily.     [provider]  oxyCODONE-acetaminophen (PERCOCET/ROXICET) 5-325 MG tablet Take 1 tablet by mouth every 4 (four) hours as needed for severe pain. 03/06/19   Allena Katz, PA-C  PROLENSA 0.07 % SOLN  04/10/20   [provider]  simvastatin (ZOCOR) 40 MG tablet Take 40 mg by mouth daily at 6 PM.    [provider]  timolol (BETIMOL) 0.5 % ophthalmic solution Place 1 drop into both eyes daily.     [provider]  timolol (TIMOPTIC) 0.5 % ophthalmic solution  03/18/20   [provider]  tiZANidine (ZANAFLEX) 2 MG tablet Take 1 tablet (2 mg total) by mouth every 6 (six) hours as needed. 03/06/19   Allena Katz, PA-C  Turmeric 500 MG CAPS Take 500 mg by mouth 2 (two) times daily.     [provider]  valsartan-hydrochlorothiazide (DIOVAN-HCT) 80-12.5 MG tablet Take 1 tablet by mouth daily.    [provider]     Family  History  Problem Relation Age of Onset   Lymphoma Mother    Pancreatic cancer Father     Social History   Socioeconomic History   Marital status: Married    Spouse name: Not on file   Number of children: Not on file   Years of education: Not on file   Highest education level: Not on file  Occupational History   Not on file  Tobacco Use   Smoking status: Former    Types: Pipe    Quit date: 21    Years since quitting: 51.4   Smokeless tobacco: Never  Vaping Use   Vaping Use: Never used  Substance and Sexual Activity   Alcohol use: Yes    Comment: rare   Drug use: Never   Sexual activity: Not on file  Other Topics Concern   Not on file  Social History  Narrative   Not on file   Social Determinants of Health   Financial Resource Strain: Not on file  Food Insecurity: Not on file  Transportation Needs: Not on file  Physical Activity: Not on file  Stress: Not on file  Social Connections: Not on file     Vital Signs: There were no vitals taken for this visit.  No physical examination was performed in lieu of virtual telephone clinic visit.   Imaging: PET 07/24/22    [place] 07/29/22   [facility] (12/24/22)   IMPRESSION: Slight interval decreased size but persistent arterial enhancement of previously treated left level 2 cervical lymph nodes. The medial lymph node demonstrates an approximately 37% volume reduction (2.0 to 1.3 mL) and the lateral lymph node demonstrates an approximately 45% volume reduction (1.8 to 1.0 mL). Overall these findings suggest improvement but persistent viability of the metastatic lymphadenopathy.    Labs:  CBC: Recent Labs    09/22/22 1100  WBC 8.5  HGB 15.1  HCT 43.6  PLT 188    COAGS: Recent Labs    09/22/22 1100  INR 1.1    BMP: No results for input(s): "NA", "K", "CL", "CO2", "GLUCOSE", "BUN", "CALCIUM", "CREATININE", "GFRNONAA", "GFRAA" in the last 8760 hours.  Invalid input(s): "CMP"  LIVER FUNCTION TESTS: No  results for input(s): "BILITOT", "AST", "ALT", "ALKPHOS", "PROT", "ALBUMIN" in the last 8760 hours.  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 He is a poor candidate for re-operation given prior thyroidectomy and left neck dissection. He has undergone radioactive iodine ablation x2.   He is now status post ultrasound guided percutaneous ethanol injection into the two prominent left left 4 lymph nodes on 09/22/22.  He is doing very well since the procedure without symptoms. He is now status post 2 month contrast-enhanced ultrasound of the treated lymph nodes which show persistent viability but volume reduction of 37% (medial) and 45% (lateral).    These findings were discussed with the patient, his wife, and Dr. Sharl Ma.  We agree that additional percutaneous ethanol injection is warranted.    Plan for ultrasound guided percutaneous ethanol injection into the two left, level 2 metastatic cervical lymph nodes at [facility] with moderate sedation.    Electronically Signed: Bennie Dallas 01/05/2023, 7:39 AM   I spent a total of 25 Minutes in virtual telephone clinical consultation, greater than 50% of which was counseling/coordinating care for metastatic thyroid cancer.

## 2023-01-11 ENCOUNTER — Other Ambulatory Visit (HOSPITAL_COMMUNITY): Payer: Self-pay | Admitting: Interventional Radiology

## 2023-01-11 DIAGNOSIS — C770 Secondary and unspecified malignant neoplasm of lymph nodes of head, face and neck: Secondary | ICD-10-CM

## 2023-01-13 DIAGNOSIS — H43813 Vitreous degeneration, bilateral: Secondary | ICD-10-CM | POA: Diagnosis not present

## 2023-01-13 DIAGNOSIS — H401132 Primary open-angle glaucoma, bilateral, moderate stage: Secondary | ICD-10-CM | POA: Diagnosis not present

## 2023-01-28 DIAGNOSIS — M25551 Pain in right hip: Secondary | ICD-10-CM | POA: Diagnosis not present

## 2023-02-08 ENCOUNTER — Other Ambulatory Visit: Payer: Self-pay | Admitting: Radiology

## 2023-02-08 DIAGNOSIS — C770 Secondary and unspecified malignant neoplasm of lymph nodes of head, face and neck: Secondary | ICD-10-CM

## 2023-02-08 NOTE — H&P (Signed)
Referring Physician(s): Kerr,J  Supervising Physician: Marliss Coots  Patient Status:  [facility] OP  Chief Complaint:    Subjective: Patient familiar to IR service from left renal mass biopsy on 05/29/2016, left lower pole thyroid nodule biopsy and left cervical adenopathy biopsy on 12/29/2016, lower left cervical adenopathy biopsy on 07/29/2022  followed by consultations with Dr. Elby Showers latest on 01/05/2023. 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 He is a poor candidate for re-operation given prior thyroidectomy and left neck dissection. He has undergone radioactive iodine ablation x2.   He is now status post ultrasound guided percutaneous ethanol injection into the two prominent level 4 lower left cervical lymph nodes on 09/22/22.  He is doing very well since the procedure without symptoms. He is now status post 2 month contrast-enhanced ultrasound of the treated lymph nodes which show persistent viability but volume reduction of 37% (medial) and 45% (lateral).  He presents today for ultrasound guided percutaneous ethanol injection into the two left, level 2 metastatic cervical lymph nodes . He denies fever, HA, chest pain, dyspnea, abd pain,N/V or bleeding. He does have occ cough, back pain.    Past Medical History:  Diagnosis Date   Arthritis    "all over" (03/23/2018)   Chronic lower back pain    Curvature of spine    High cholesterol    Hypertension    Hypothyroidism    Kidney cysts 02/10/2017   bilateral   OSA on CPAP    PONV (postoperative nausea and vomiting)    "just w/back OR in ~ 1993"   Prostate CA (HCC) 2001   Thyroid cancer (HCC) 02/10/2017   Past Surgical History:  Procedure Laterality Date   BACK SURGERY     CARDIAC CATHETERIZATION  2002   HEMATOMA EVACUATION N/A 02/13/2017   Procedure: EVACUATION HEMATOMA, NECK;  Surgeon: Christia Reading, MD;  Location: [facility] OR;  Service: ENT;   Laterality: N/A;   HERNIA REPAIR     IR RADIOLOGIST EVAL & MGMT  08/26/2022   IR RADIOLOGIST EVAL & MGMT  10/19/2022   IR RADIOLOGIST EVAL & MGMT  01/05/2023   JOINT REPLACEMENT     LUMBAR DISC SURGERY  1993   "herniated"   PROSTATECTOMY  2001   RADICAL NECK DISSECTION Left 02/12/2017   Procedure: RADICAL NECK DISSECTION;  Surgeon: Serena Colonel, MD;  Location: [facility] OR;  Service: ENT;  Laterality: Left;  Left modified neck dissection levels 1 through 4   THYROIDECTOMY N/A 02/12/2017   Procedure: THYROIDECTOMY;  Surgeon: Serena Colonel, MD;  Location: [facility] OR;  Service: ENT;  Laterality: N/A;  total   TONSILLECTOMY     TOTAL KNEE ARTHROPLASTY Left 03/23/2018   TOTAL KNEE ARTHROPLASTY Left 03/23/2018   Procedure: TOTAL KNEE ARTHROPLASTY;  Surgeon: Gean Birchwood, MD;  Location: [facility] OR;  Service: Orthopedics;  Laterality: Left;   TOTAL KNEE ARTHROPLASTY Right 03/06/2019   Procedure: Right Knee Arthroplasty;  Surgeon: Gean Birchwood, MD;  Location: [facility] ORS;  Service: Orthopedics;  Laterality: Right;   UMBILICAL HERNIA REPAIR        Allergies: Codeine  Medications: Prior to Admission medications   Medication Sig Start Date End Date Taking? Authorizing Provider  aspirin EC 81 MG tablet Take 1 tablet (81 mg total) by mouth 2 (two) times daily. 03/06/19   Allena Katz, PA-C  Calcium Carbonate-Vitamin D (CALCIUM 600/VITAMIN D PO) Take 1 tablet by mouth daily.  [provider]  cetirizine (ZYRTEC) 10 MG tablet Take 10 mg by mouth daily.    [provider]  Cholecalciferol (VITAMIN D-3 PO) Take 250 mcg by mouth daily.     [provider]  Coenzyme Q10 (COQ10) 100 MG CAPS Take 100 mg by mouth daily.     [provider]  ILEVRO 0.3 % ophthalmic suspension  01/31/20   [provider]  levothyroxine (SYNTHROID, LEVOTHROID) 175 MCG tablet Take 175 mcg by mouth daily before breakfast.    [provider]  lisinopril (PRINIVIL,ZESTRIL) 20 MG tablet Take 20 mg by  mouth daily.    [provider]  Multiple Vitamins-Minerals (ONE-A-DAY MENS 50+ ADVANTAGE) TABS Take 1 tablet by mouth daily.    [provider]  ofloxacin (OCUFLOX) 0.3 % ophthalmic solution  02/01/20   [provider]  Omega-3 Fatty Acids (FISH OIL ULTRA) 1400 MG CAPS Take 1,400 mg by mouth daily.     [provider]  oxyCODONE-acetaminophen (PERCOCET/ROXICET) 5-325 MG tablet Take 1 tablet by mouth every 4 (four) hours as needed for severe pain. 03/06/19   Allena Katz, PA-C  PROLENSA 0.07 % SOLN  04/10/20   [provider]  simvastatin (ZOCOR) 40 MG tablet Take 40 mg by mouth daily at 6 PM.    [provider]  timolol (BETIMOL) 0.5 % ophthalmic solution Place 1 drop into both eyes daily.     [provider]  timolol (TIMOPTIC) 0.5 % ophthalmic solution  03/18/20   [provider]  tiZANidine (ZANAFLEX) 2 MG tablet Take 1 tablet (2 mg total) by mouth every 6 (six) hours as needed. 03/06/19   Allena Katz, PA-C  Turmeric 500 MG CAPS Take 500 mg by mouth 2 (two) times daily.     [provider]  valsartan-hydrochlorothiazide (DIOVAN-HCT) 80-12.5 MG tablet Take 1 tablet by mouth daily.    [provider]     Vital Signs:temp 98.2  BP 126/88  R 16  O2 SATS 97% RA    Physical Exam: awake/alert; chest- CTA bilat; heart- RRR; abd- soft,+BS,NT; trace pretibial edema  Imaging: No results found.  Labs:  CBC: Recent Labs    09/22/22 1100  WBC 8.5  HGB 15.1  HCT 43.6  PLT 188    COAGS: Recent Labs    09/22/22 1100  INR 1.1    BMP: No results for input(s): "NA", "K", "CL", "CO2", "GLUCOSE", "BUN", "CALCIUM", "CREATININE", "GFRNONAA", "GFRAA" in the last 8760 hours.  Invalid input(s): "CMP"  LIVER FUNCTION TESTS: No results for input(s): "BILITOT", "AST", "ALT", "ALKPHOS", "PROT", "ALBUMIN" in the last 8760 hours.  Assessment and Plan: 82 year old male with history of recurrent,  metastatic papillary thyroid carcinoma to 2 left inferior cervical lymph nodes. He is a poor candidate for re-operation given prior thyroidectomy and left neck dissection. He has undergone radioactive iodine ablation x2.   He is now status post ultrasound guided percutaneous ethanol injection into the two prominent level 4 lower left cervical lymph nodes on 09/22/22.  He is doing very well since the procedure without symptoms. He is now status post 2 month contrast-enhanced ultrasound of the treated lymph nodes which show persistent viability but volume reduction of 37% (medial) and 45% (lateral).  He presents today for ultrasound guided percutaneous ethanol injection into the two left, level 2 metastatic cervical lymph nodes .  Details/risks of procedures, including but not limited to, internal bleeding, infection, injury to adjacent structures discussed with patient/spouse  with his understanding and consent.   Electronically Signed: D. Jeananne Rama, PA-C 02/08/2023, 6:04 PM   I spent a total of 20 minutes at the the patient's bedside AND on the patient's hospital floor or unit, greater than 50% of which was counseling/coordinating care for ultrasound guided percutaneous ethanol injection into  two left, level 2 metastatic cervical lymph nodes(thyroid cancer)

## 2023-02-09 ENCOUNTER — Other Ambulatory Visit: Payer: Self-pay

## 2023-02-09 ENCOUNTER — Encounter (HOSPITAL_COMMUNITY): Payer: Self-pay

## 2023-02-09 ENCOUNTER — Other Ambulatory Visit (HOSPITAL_COMMUNITY): Payer: Self-pay | Admitting: Interventional Radiology

## 2023-02-09 ENCOUNTER — Ambulatory Visit (HOSPITAL_COMMUNITY)
Admission: RE | Admit: 2023-02-09 | Discharge: 2023-02-09 | Disposition: A | Discharge status: Home / Self Care | Payer: Medicare PPO | Source: Ambulatory Visit | Attending: Interventional Radiology | Admitting: Interventional Radiology

## 2023-02-09 DIAGNOSIS — C73 Malignant neoplasm of thyroid gland: Secondary | ICD-10-CM | POA: Diagnosis not present

## 2023-02-09 DIAGNOSIS — C773 Secondary and unspecified malignant neoplasm of axilla and upper limb lymph nodes: Secondary | ICD-10-CM | POA: Diagnosis not present

## 2023-02-09 DIAGNOSIS — C770 Secondary and unspecified malignant neoplasm of lymph nodes of head, face and neck: Secondary | ICD-10-CM

## 2023-02-09 LAB — CBC WITH DIFFERENTIAL/PLATELET
Abs Immature Granulocytes: 0.11 10*3/uL — ABNORMAL HIGH (ref 0.00–0.07)
Basophils Absolute: 0.1 10*3/uL (ref 0.0–0.1)
Basophils Relative: 1 %
Eosinophils Absolute: 0.2 10*3/uL (ref 0.0–0.5)
Eosinophils Relative: 3 %
HCT: 45.7 % (ref 39.0–52.0)
Hemoglobin: 15.6 g/dL (ref 13.0–17.0)
Immature Granulocytes: 1 %
Lymphocytes Relative: 19 %
Lymphs Abs: 1.5 10*3/uL (ref 0.7–4.0)
MCH: 31.2 pg (ref 26.0–34.0)
MCHC: 34.1 g/dL (ref 30.0–36.0)
MCV: 91.4 fL (ref 80.0–100.0)
Monocytes Absolute: 0.8 10*3/uL (ref 0.1–1.0)
Monocytes Relative: 10 %
Neutro Abs: 5.2 10*3/uL (ref 1.7–7.7)
Neutrophils Relative %: 66 %
Platelets: 188 10*3/uL (ref 150–400)
RBC: 5 MIL/uL (ref 4.22–5.81)
RDW: 14.1 % (ref 11.5–15.5)
WBC: 7.9 10*3/uL (ref 4.0–10.5)
nRBC: 0 % (ref 0.0–0.2)

## 2023-02-09 MED ORDER — SODIUM CHLORIDE 0.9 % IV SOLN: INTRAVENOUS | Status: DC

## 2023-02-09 MED ORDER — ALCOHOL (ABLYSINOL) 99% IA SOLN
INTRA_ARTERIAL | Status: AC
  Filled 2023-02-09: qty 10

## 2023-02-09 MED ORDER — MIDAZOLAM HCL 2 MG/2ML IJ SOLN
INTRAMUSCULAR | Status: AC | PRN
  Administered 2023-02-09: 1 mg via INTRAVENOUS

## 2023-02-09 MED ORDER — FENTANYL CITRATE (PF) 100 MCG/2ML IJ SOLN
INTRAMUSCULAR | Status: AC
  Filled 2023-02-09: qty 2

## 2023-02-09 MED ORDER — MIDAZOLAM HCL 2 MG/2ML IJ SOLN
INTRAMUSCULAR | Status: AC
  Filled 2023-02-09: qty 2

## 2023-02-09 MED ORDER — FENTANYL CITRATE (PF) 100 MCG/2ML IJ SOLN
INTRAMUSCULAR | Status: AC | PRN
  Administered 2023-02-09 (×2): 50 ug via INTRAVENOUS

## 2023-02-09 MED ORDER — LIDOCAINE HCL 1 % IJ SOLN
INTRAMUSCULAR | Status: AC
  Filled 2023-02-09: qty 20

## 2023-02-09 NOTE — Procedures (Signed)
Interventional Radiology Procedure Note  Procedure: Ultrasound guided percutaneous ethanol injection  Findings: Please refer to procedural dictation for full description. PEI of two level 4 metastatic cervical lymph nodes.  0.6 cc in lateral node, 1.0 cc in medial.    Complications: None immediate  Estimated Blood Loss: < 5 mL  Recommendations: Follow up in 3 months with [facility].   Marliss Coots, MD

## 2023-02-09 NOTE — Discharge Instructions (Addendum)
Please call Interventional Radiology clinic [phone] with any questions or concerns.  You may remove your dressing and shower tomorrow.   Moderate Conscious Sedation, Adult, Care After This sheet gives you information about how to care for yourself after your procedure. Your health care provider may also give you more specific instructions. If you have problems or questions, contact your health care provider. What can I expect after the procedure? After the procedure, it is common to have: Sleepiness for several hours. Impaired judgment for several hours. Difficulty with balance. Vomiting if you eat too soon. Follow these instructions at home: For the time period you were told by your health care provider: Rest. Do not participate in activities where you could fall or become injured. Do not drive or use machinery. Do not drink alcohol. Do not take sleeping pills or medicines that cause drowsiness. Do not make important decisions or sign legal documents. Do not take care of children on your own.        Eating and drinking Follow the diet recommended by your health care provider. Drink enough fluid to keep your urine pale yellow. If you vomit: Drink water, juice, or soup when you can drink without vomiting. Make sure you have little or no nausea before eating solid foods.    General instructions Take over-the-counter and prescription medicines only as told by your health care provider. Have a responsible adult stay with you for the time you are told. It is important to have someone help care for you until you are awake and alert. Do not smoke. Keep all follow-up visits as told by your health care provider. This is important. Contact a health care provider if: You are still sleepy or having trouble with balance after 24 hours. You feel light-headed. You keep feeling nauseous or you keep vomiting. You develop a rash. You have a fever. You have redness or swelling around  the IV site. Get help right away if: You have trouble breathing. You have new-onset confusion at home. Summary After the procedure, it is common to feel sleepy, have impaired judgment, or feel nauseous if you eat too soon. Rest after you get home. Know the things you should not do after the procedure. Follow the diet recommended by your health care provider and drink enough fluid to keep your urine pale yellow. Get help right away if you have trouble breathing or new-onset confusion at home. This information is not intended to replace advice given to you by your health care provider. Make sure you discuss any questions you have with your health care provider. Document Revised: 12/08/2019 Document Reviewed: 07/06/2019 Elsevier Patient Education  2021 [employer].

## 2023-02-19 IMAGING — CR DG HAND COMPLETE 3+V*R*
3 series · 3 of 3 positions shown · non-contrast
Comparison: None.

CLINICAL DATA: Pain following fall

EXAM:
RIGHT HAND - COMPLETE 3+ VIEW

[x hand pa right]
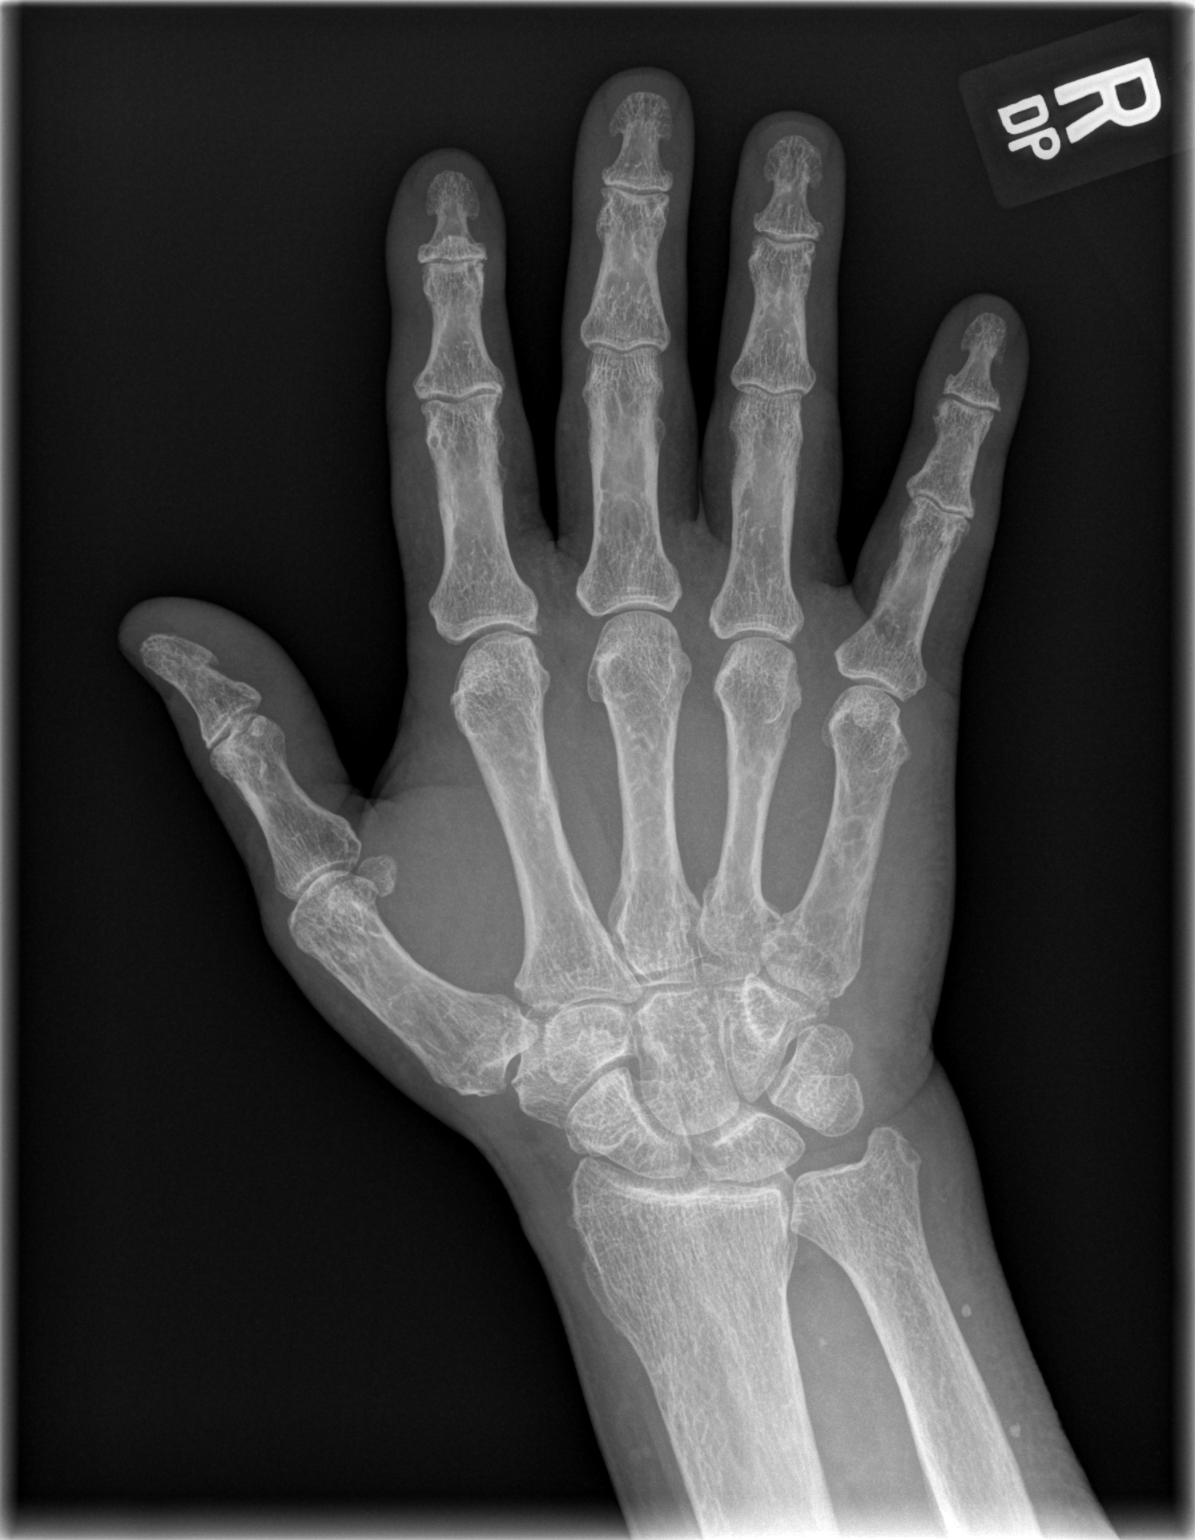

[x hand oblique right]
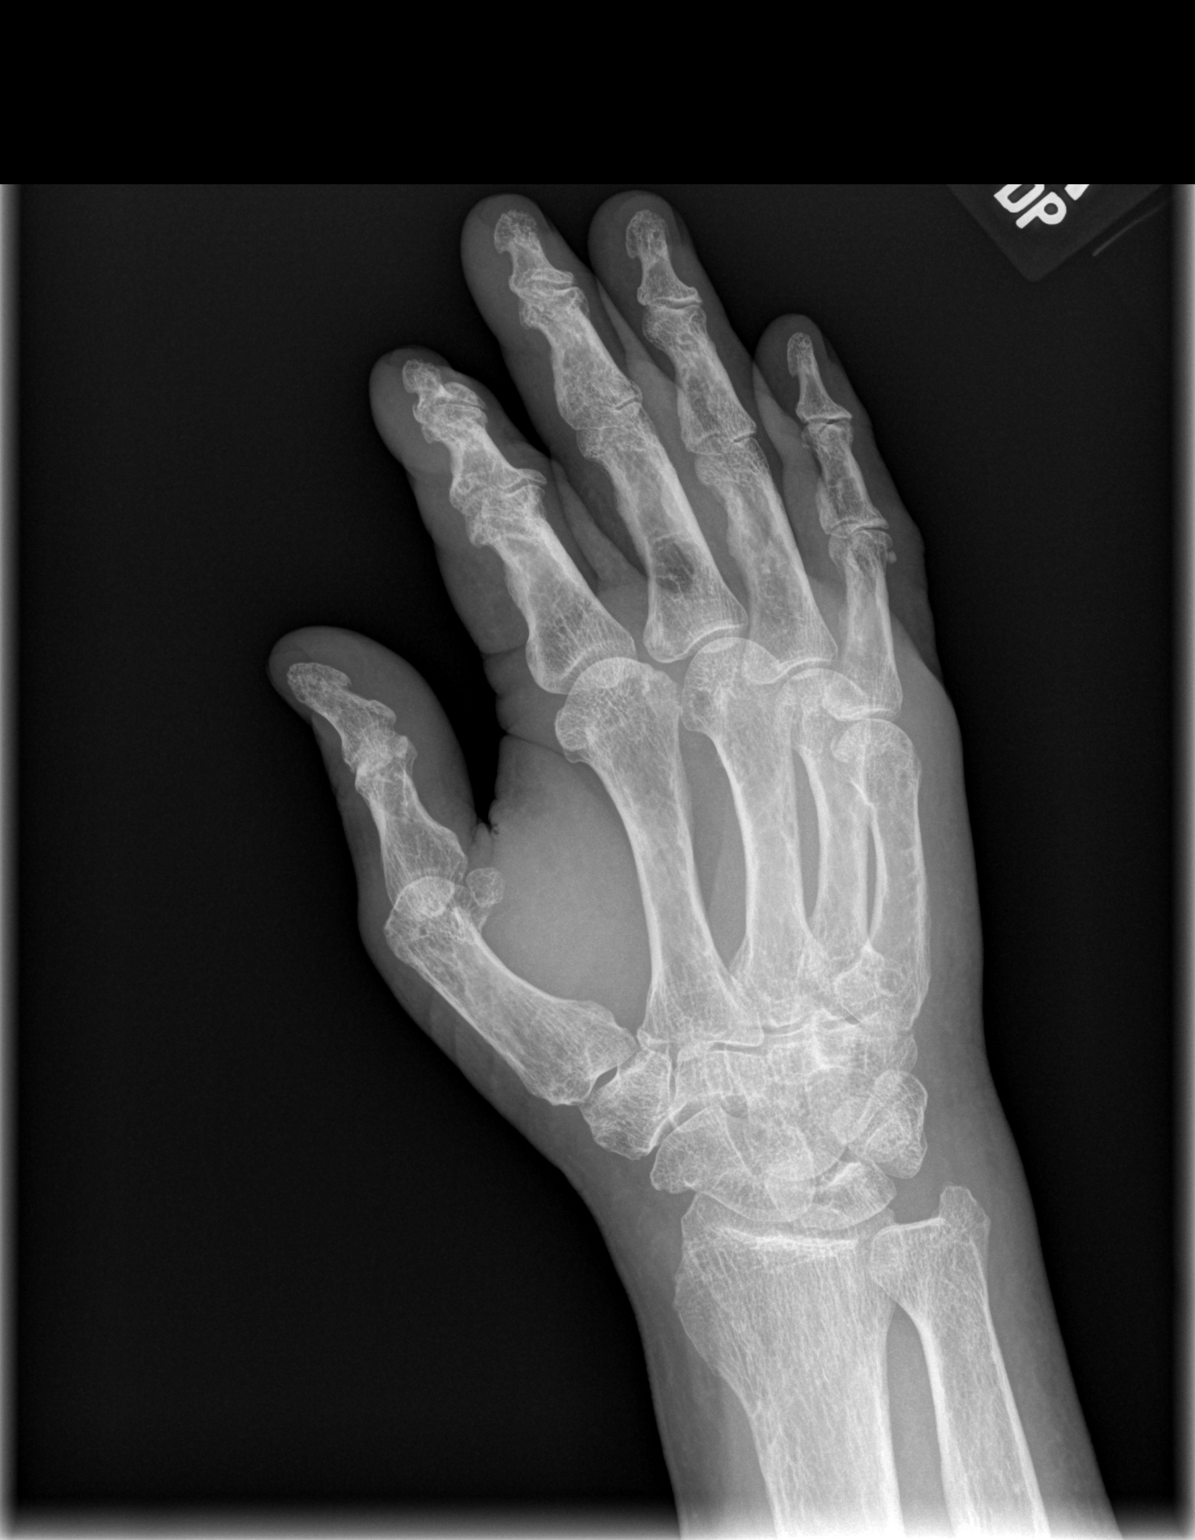

[x hand lat right]
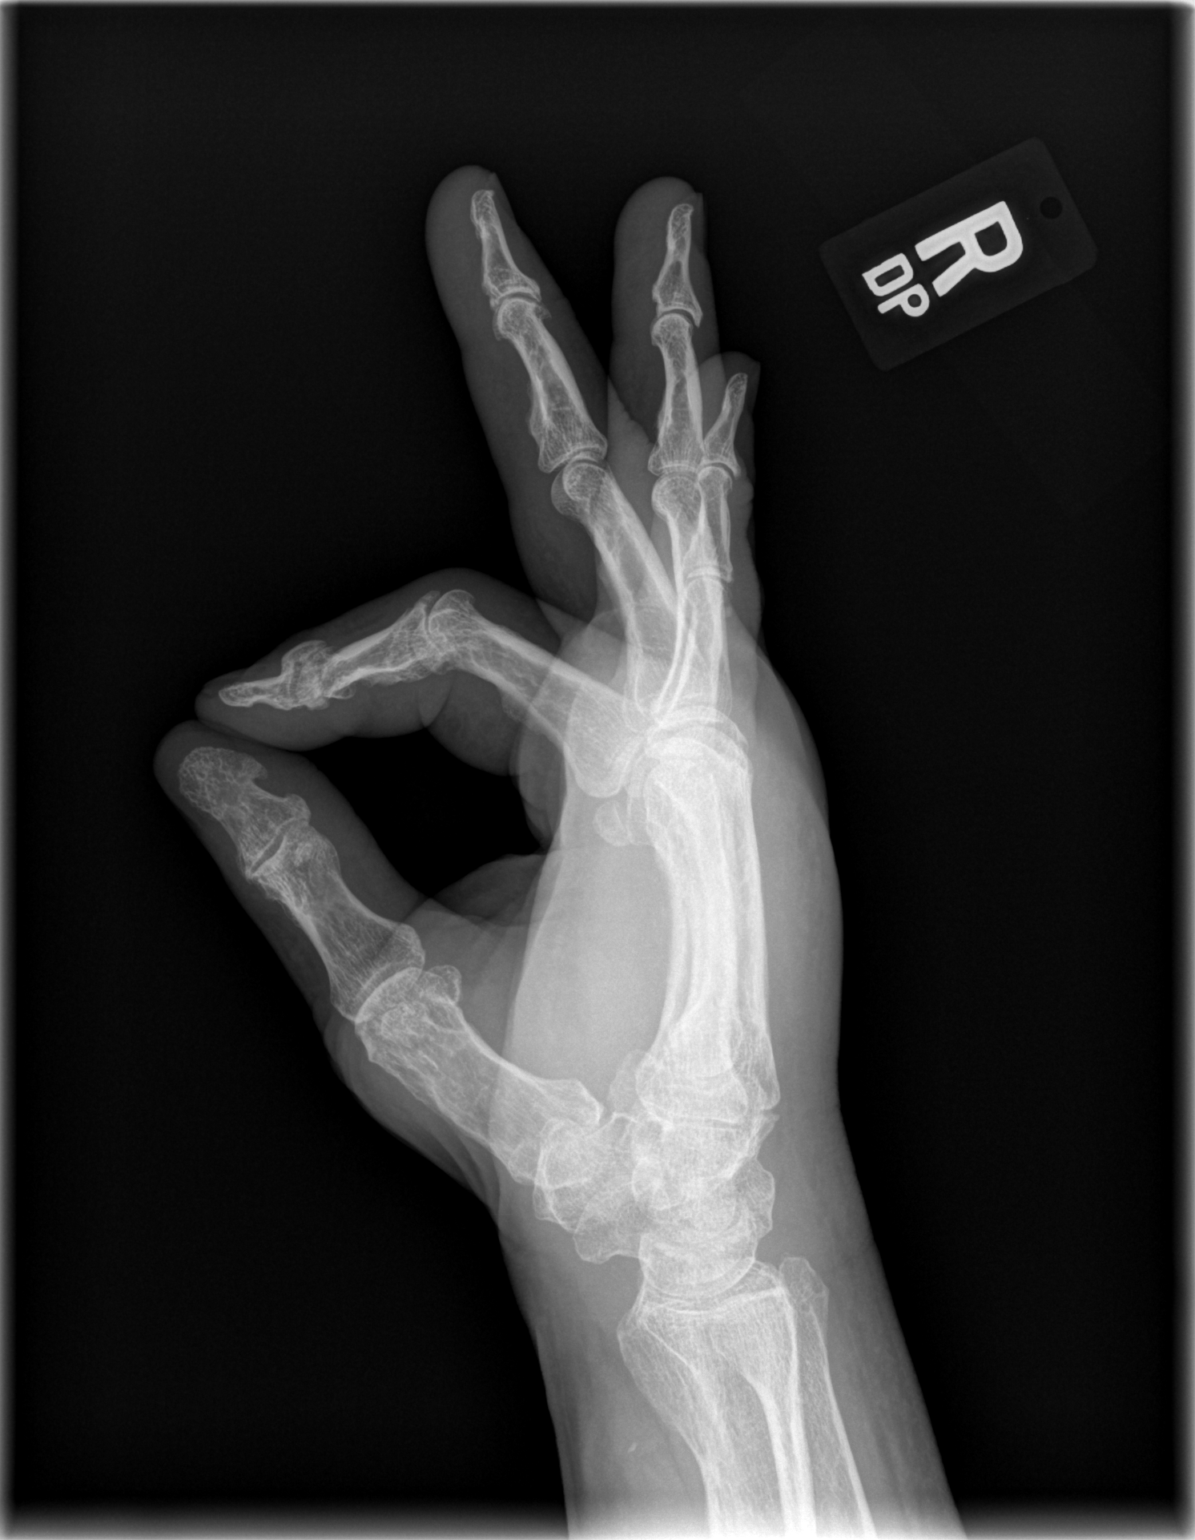

[3 of 3 positions shown; findings below may reference images not displayed]

FINDINGS: Frontal, oblique, and lateral views were obtained. There is soft
tissue swelling. There is no fracture or dislocation. There is
osteoarthritic change in the first IP joint as well as in the second
and fifth D IP and fifth PIP joint regions. No erosions. Underlying
osteoporosis.
IMPRESSION: Osteoarthritic change in several distal joints. No acute fracture or
dislocation. Osteoporosis. There is soft tissue swelling.

## 2023-02-19 IMAGING — CR DG FACIAL BONES COMPLETE 3+V
3 series · 3 of 3 positions shown · non-contrast
Comparison: None.

CLINICAL DATA: Facial injury

EXAM:
FACIAL BONES COMPLETE 3+V

[[person_name] * (1 of 2)]
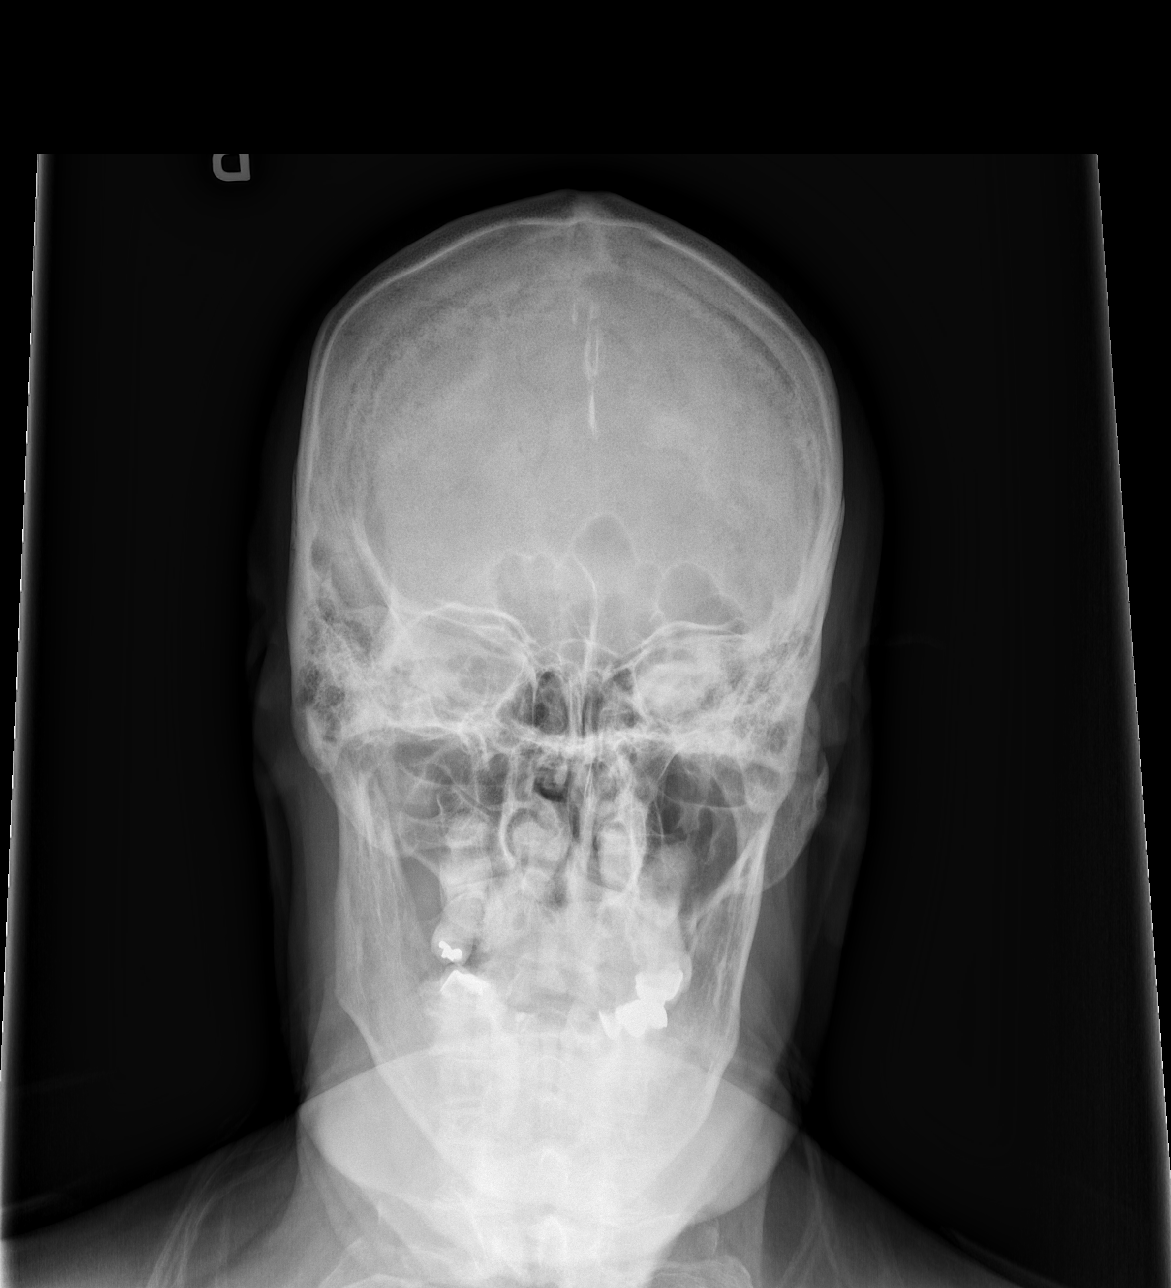

[[person_name] * (2 of 2)]
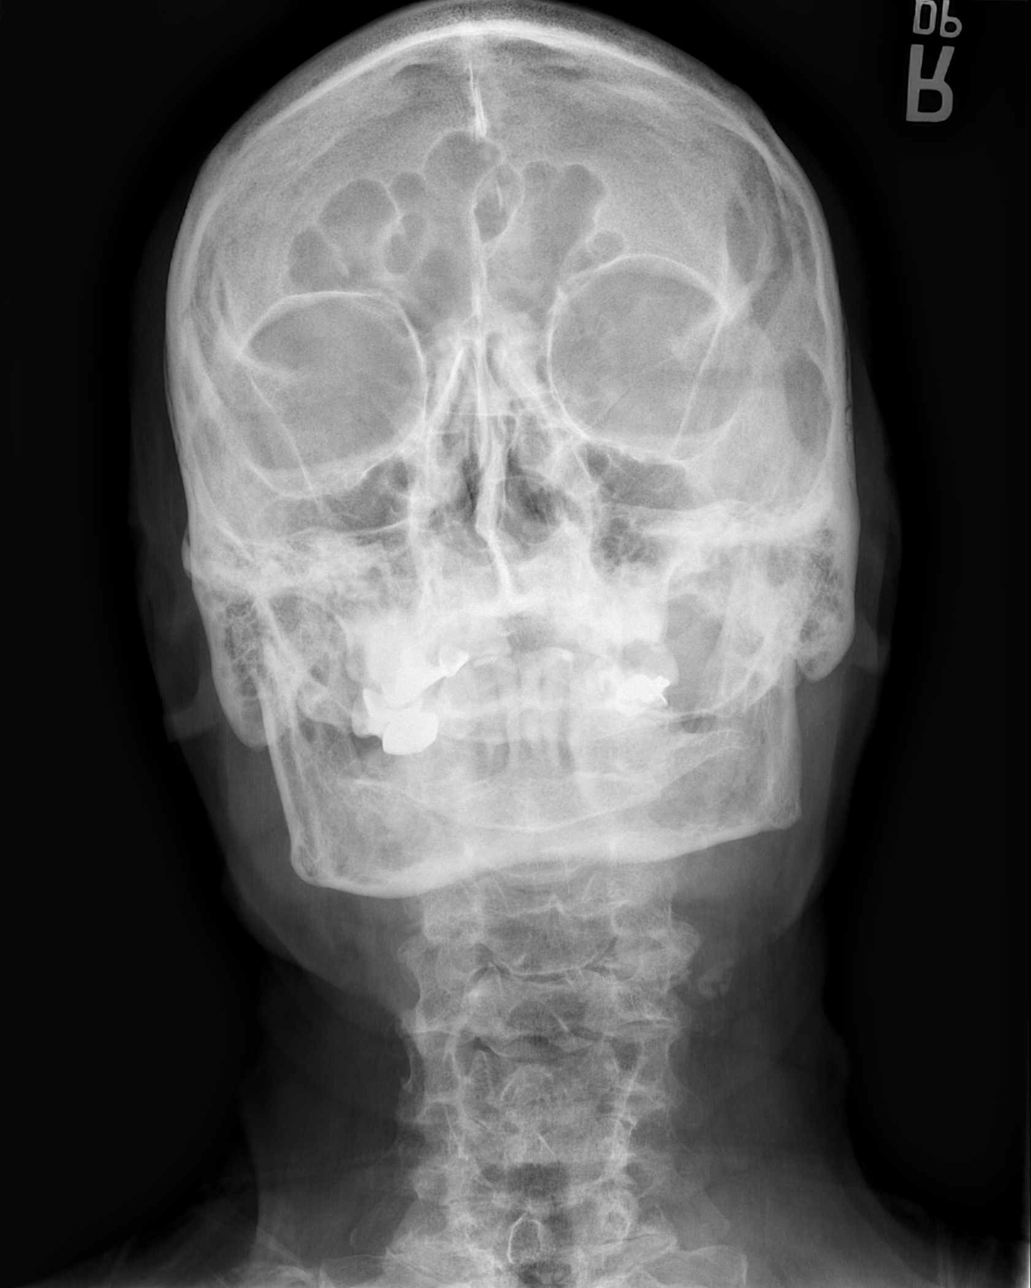

[w skull lat *]
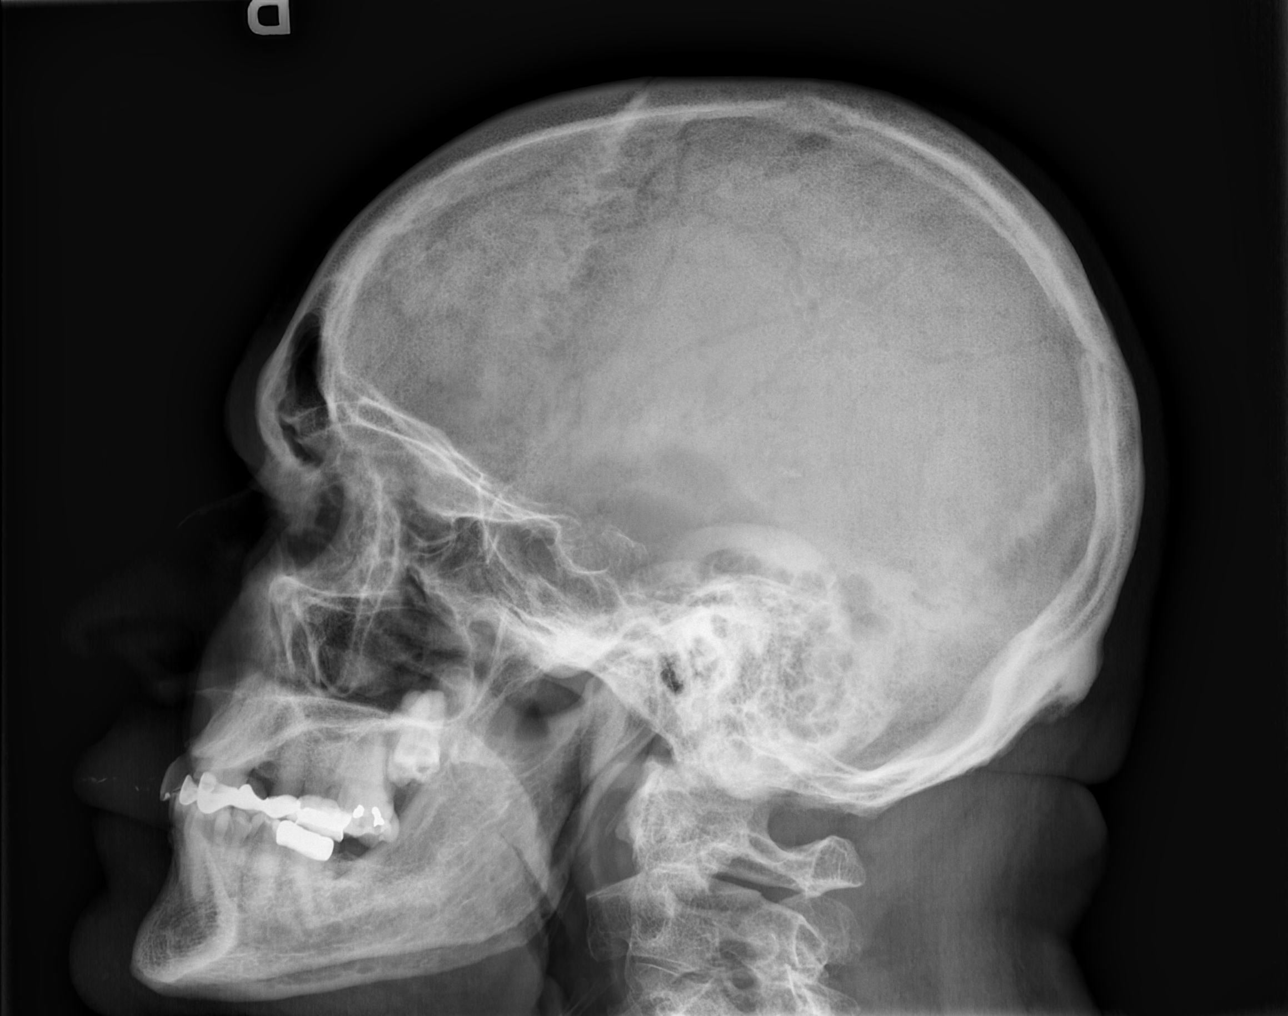

[3 of 3 positions shown; findings below may reference images not displayed]

FINDINGS: Frontal, Towne's, and lateral views of the calvarium and facial
bones are obtained. There are no acute displaced fractures. Nasal
septum is deviated to the left. The nasal passage is clear.
Paranasal sinuses are unremarkable.
IMPRESSION: 1. No acute facial bone fractures.

## 2023-03-09 DIAGNOSIS — G4733 Obstructive sleep apnea (adult) (pediatric): Secondary | ICD-10-CM | POA: Diagnosis not present

## 2023-04-27 DIAGNOSIS — C7800 Secondary malignant neoplasm of unspecified lung: Secondary | ICD-10-CM | POA: Diagnosis not present

## 2023-04-27 DIAGNOSIS — C73 Malignant neoplasm of thyroid gland: Secondary | ICD-10-CM | POA: Diagnosis not present

## 2023-04-27 DIAGNOSIS — I700 Atherosclerosis of aorta: Secondary | ICD-10-CM | POA: Diagnosis not present

## 2023-04-27 DIAGNOSIS — E890 Postprocedural hypothyroidism: Secondary | ICD-10-CM | POA: Diagnosis not present

## 2023-04-27 DIAGNOSIS — C770 Secondary and unspecified malignant neoplasm of lymph nodes of head, face and neck: Secondary | ICD-10-CM | POA: Diagnosis not present

## 2023-04-27 DIAGNOSIS — I10 Essential (primary) hypertension: Secondary | ICD-10-CM | POA: Diagnosis not present

## 2023-04-27 DIAGNOSIS — G4733 Obstructive sleep apnea (adult) (pediatric): Secondary | ICD-10-CM | POA: Diagnosis not present

## 2023-04-27 DIAGNOSIS — C801 Malignant (primary) neoplasm, unspecified: Secondary | ICD-10-CM | POA: Diagnosis not present

## 2023-04-27 DIAGNOSIS — Z8546 Personal history of malignant neoplasm of prostate: Secondary | ICD-10-CM | POA: Diagnosis not present

## 2023-04-27 DIAGNOSIS — E782 Mixed hyperlipidemia: Secondary | ICD-10-CM | POA: Diagnosis not present

## 2023-05-05 DIAGNOSIS — C770 Secondary and unspecified malignant neoplasm of lymph nodes of head, face and neck: Secondary | ICD-10-CM | POA: Diagnosis not present

## 2023-05-05 DIAGNOSIS — E890 Postprocedural hypothyroidism: Secondary | ICD-10-CM | POA: Diagnosis not present

## 2023-05-05 DIAGNOSIS — C73 Malignant neoplasm of thyroid gland: Secondary | ICD-10-CM | POA: Diagnosis not present

## 2023-05-11 ENCOUNTER — Ambulatory Visit (HOSPITAL_COMMUNITY)
Admission: RE | Admit: 2023-05-11 | Discharge: 2023-05-11 | Disposition: A | Discharge status: Home / Self Care | Payer: Medicare PPO | Source: Ambulatory Visit | Attending: Interventional Radiology | Admitting: Interventional Radiology

## 2023-05-11 ENCOUNTER — Encounter (HOSPITAL_COMMUNITY): Payer: Self-pay

## 2023-05-11 DIAGNOSIS — C770 Secondary and unspecified malignant neoplasm of lymph nodes of head, face and neck: Secondary | ICD-10-CM | POA: Insufficient documentation

## 2023-05-11 DIAGNOSIS — E041 Nontoxic single thyroid nodule: Secondary | ICD-10-CM | POA: Diagnosis not present

## 2023-05-11 MED ORDER — SULFUR HEXAFLUORIDE MICROSPH 60.7-25 MG IJ SUSR
INTRAMUSCULAR | Status: AC
  Filled 2023-05-11: qty 5

## 2023-05-11 MED ORDER — SULFUR HEXAFLUORIDE MICROSPH 60.7-25 MG IJ SUSR
5.0000 mL | Freq: Once | INTRAMUSCULAR | Status: AC | PRN
  Administered 2023-05-11: 1.7 mL via INTRAVENOUS

## 2023-05-11 MED ORDER — SODIUM CHLORIDE 0.9 % IV SOLN: INTRAVENOUS | Status: DC

## 2023-05-13 ENCOUNTER — Other Ambulatory Visit: Payer: Self-pay | Admitting: Interventional Radiology

## 2023-05-13 DIAGNOSIS — C770 Secondary and unspecified malignant neoplasm of lymph nodes of head, face and neck: Secondary | ICD-10-CM

## 2023-05-18 NOTE — Progress Notes (Signed)
Reason for follow up: Patient was seen in virtual telephone visit today for follow up after left metastatic cervical lymph node percutaneous ethanol injections.  Referring Physician(s): Talmage Coin  History of present illness: Initial HPI from 08/26/22:  [name] is a 82 y.o. 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 Serena Colonel in 2018.  For persistently elevated thyroglobulin and local recurrence, he is also status post radioactive iodine therapy in March 2019 and again in February 2023.  CT neck in October 2023 demonstrated slight enlargement of left cervical lymph nodes, which were confirmed hypermetabolic on PET/CT in early December.  He underwent core biopsy of one of the nodules on 07/29/22 which showed metastatic papillary thyroid carcinoma.   Per evaluation with Drs. Sharl Ma and Pollyann Kennedy, there are no other ideal treatment options for [name] with the exception of percutaneous ablation, for which he presents to discuss today.   Today he reports feeling well with no real symptoms. No dysphagia or shortness of breath.  No neck pain.   Ultrasound-guided ethanol injection into the two biopsy proven metastatic left level 4 lymph nodes 09/22/22 was technically successful and without complication. He did very well post-procedure and was without pain, skin changes or new symptoms. A contrast-enhanced ultrasound evaluation 12/24/22 unfortunately showed persistent enhancement of both lymph nodes indicative of viability. However there was a  37% volume reduction of the medial node and 45% volume reduction of the lateral node.  I discussed these results with Dr. Sharl Ma at that time.  He informed me that [name]'s thyroglobulin labs (not privy to me) had decreased considerably, which suggested a favorable response to treatment.  We discussed the possibility of repeat percutaneous ethanol  injection and I re-treated both of these lymph nodes 02/09/23.   [name] presents today via virtual telephone visit for follow up and to discuss his recent thyroid ultrasound imaging. He is feeling well, remaining asymptomatic.  Past Medical History:  Diagnosis Date   Arthritis    "all over" (03/23/2018)   Chronic lower back pain    Curvature of spine    High cholesterol    Hypertension    Hypothyroidism    Kidney cysts 02/10/2017   bilateral   OSA on CPAP    PONV (postoperative nausea and vomiting)    "just w/back OR in ~ 1993"   Prostate CA (HCC) 2001   Thyroid cancer (HCC) 02/10/2017    Past Surgical History:  Procedure Laterality Date   BACK SURGERY     CARDIAC CATHETERIZATION  2002   HEMATOMA EVACUATION N/A 02/13/2017   Procedure: EVACUATION HEMATOMA, NECK;  Surgeon: Christia Reading, MD;  Location: [facility] OR;  Service: ENT;  Laterality: N/A;   HERNIA REPAIR     IR RADIOLOGIST EVAL & MGMT  08/26/2022   IR RADIOLOGIST EVAL & MGMT  10/19/2022   IR RADIOLOGIST EVAL & MGMT  01/05/2023   JOINT REPLACEMENT     LUMBAR DISC SURGERY  1993   "herniated"   PROSTATECTOMY  2001   RADICAL NECK DISSECTION Left 02/12/2017   Procedure: RADICAL NECK DISSECTION;  Surgeon: Serena Colonel, MD;  Location: [facility] OR;  Service: ENT;  Laterality: Left;  Left modified neck dissection levels 1 through 4   THYROIDECTOMY N/A 02/12/2017   Procedure: THYROIDECTOMY;  Surgeon: Serena Colonel, MD;  Location: [facility] OR;  Service: ENT;  Laterality: N/A;  total   TONSILLECTOMY  TOTAL KNEE ARTHROPLASTY Left 03/23/2018   TOTAL KNEE ARTHROPLASTY Left 03/23/2018   Procedure: TOTAL KNEE ARTHROPLASTY;  Surgeon: Gean Birchwood, MD;  Location: [facility] OR;  Service: Orthopedics;  Laterality: Left;   TOTAL KNEE ARTHROPLASTY Right 03/06/2019   Procedure: Right Knee Arthroplasty;  Surgeon: Gean Birchwood, MD;  Location: [facility] ORS;  Service: Orthopedics;  Laterality: Right;   UMBILICAL HERNIA REPAIR       Allergies: Codeine  Medications: Prior to Admission medications   Medication Sig Start Date End Date Taking? Authorizing Provider  aspirin EC 81 MG tablet Take 1 tablet (81 mg total) by mouth 2 (two) times daily. 03/06/19   Allena Katz, PA-C  Calcium Carbonate-Vitamin D (CALCIUM 600/VITAMIN D PO) Take 1 tablet by mouth daily.    [provider]  cetirizine (ZYRTEC) 10 MG tablet Take 10 mg by mouth daily.    [provider]  Cholecalciferol (VITAMIN D-3 PO) Take 250 mcg by mouth daily.     [provider]  Coenzyme Q10 (COQ10) 100 MG CAPS Take 100 mg by mouth daily.     [provider]  ILEVRO 0.3 % ophthalmic suspension  01/31/20   [provider]  levothyroxine (SYNTHROID, LEVOTHROID) 175 MCG tablet Take 175 mcg by mouth daily before breakfast.    [provider]  lisinopril (PRINIVIL,ZESTRIL) 20 MG tablet Take 20 mg by mouth daily.    [provider]  Multiple Vitamins-Minerals (ONE-A-DAY MENS 50+ ADVANTAGE) TABS Take 1 tablet by mouth daily.    [provider]  ofloxacin (OCUFLOX) 0.3 % ophthalmic solution  02/01/20   [provider]  Omega-3 Fatty Acids (FISH OIL ULTRA) 1400 MG CAPS Take 1,400 mg by mouth daily.     [provider]  oxyCODONE-acetaminophen (PERCOCET/ROXICET) 5-325 MG tablet Take 1 tablet by mouth every 4 (four) hours as needed for severe pain. 03/06/19   Allena Katz, PA-C  PROLENSA 0.07 % SOLN  04/10/20   [provider]  simvastatin (ZOCOR) 40 MG tablet Take 40 mg by mouth daily at 6 PM.    [provider]  timolol (BETIMOL) 0.5 % ophthalmic solution Place 1 drop into both eyes daily.     [provider]  timolol (TIMOPTIC) 0.5 % ophthalmic solution  03/18/20   [provider]  tiZANidine (ZANAFLEX) 2 MG tablet Take 1 tablet (2 mg total) by mouth every 6 (six) hours as needed. 03/06/19   Allena Katz, PA-C  Turmeric 500 MG CAPS Take 500  mg by mouth 2 (two) times daily.     [provider]  valsartan-hydrochlorothiazide (DIOVAN-HCT) 80-12.5 MG tablet Take 1 tablet by mouth daily.    [provider]     Family History  Problem Relation Age of Onset   Lymphoma Mother    Pancreatic cancer Father     Social History   Socioeconomic History   Marital status: Married    Spouse name: Not on file   Number of children: Not on file   Years of education: Not on file   Highest education level: Not on file  Occupational History   Not on file  Tobacco Use   Smoking status: Former    Types: Pipe    Quit date: 8    Years since quitting: 51.7   Smokeless tobacco: Never  Vaping Use   Vaping status: Never Used  Substance and Sexual Activity   Alcohol use: Yes    Comment: rare   Drug use: Never  Sexual activity: Not on file  Other Topics Concern   Not on file  Social History Narrative   Not on file   Social Determinants of Health   Financial Resource Strain: Not on file  Food Insecurity: Not on file  Transportation Needs: Not on file  Physical Activity: Not on file  Stress: Not on file  Social Connections: Not on file     Vital Signs: There were no vitals taken for this visit.  No physical exam was performed in lieu of virtual telephone visit.   Imaging: PET 07/24/22    [place] 07/29/22    CEUS (12/24/22)   IMPRESSION: Slight interval decreased size but persistent arterial enhancement of previously treated left level 2 cervical lymph nodes. The medial lymph node demonstrates an approximately 37% volume reduction (2.0 to 1.3 mL) and the lateral lymph node demonstrates an approximately 45% volume reduction (1.8 to 1.0 mL). Overall these findings suggest improvement but persistent viability of the metastatic lymphadenopathy.    CEUS 05/11/23  Continued interval decreased size of the previously treated left level 4 cervical lymph nodes. There is now minimal to no contrast enhancement of  the most medial lymph node which demonstrates in overall volume reduction of 78% since September 22, 2022. There is persistent peripheral contrast enhancement of the most lateral lymph node which demonstrates an overall volume reduction of 68% since September 22, 2022.    Labs:  CBC: Recent Labs    09/22/22 1100 02/09/23 1110  WBC 8.5 7.9  HGB 15.1 15.6  HCT 43.6 45.7  PLT 188 188    COAGS: Recent Labs    09/22/22 1100  INR 1.1    BMP: No results for input(s): "NA", "K", "CL", "CO2", "GLUCOSE", "BUN", "CALCIUM", "CREATININE", "GFRNONAA", "GFRAA" in the last 8760 hours.  Invalid input(s): "CMP"  LIVER FUNCTION TESTS: No results for input(s): "BILITOT", "AST", "ALT", "ALKPHOS", "PROT", "ALBUMIN" in the last 8760 hours.  Assessment and Plan: 82 year old-male with history of recurrent, metastatic papillary thyroid carcinoma to 2 left inferior cervical lymph nodes. He is a poor candidate for re-operation given prior thyroidectomy and left neck dissection. He has undergone radioactive iodine ablation x2. He is now status post ultrasound guided percutaneous ethanol injection into the two prominent level 4 left cervical metastatic lymph nodes on 09/22/22 and again on 02/09/23.  There has been overall 78% volume reduction in the medial nodule now with scant/no enhancement on CEUS.  There has been overall 68% volume reduction in the lateral nodule, with continues to enhance peripherally on CEUS.  After discussion with Dr. Sharl Ma regarding his hormone markers, he continues to show favorable response but these numbers remain slightly elevated.  Given these findings, we agree that re-treatment of the lateral, persistently enhancing nodule is warranted.  Mr. [place] and I reviewed the indication, risks, and benefits of percutaneous ethanol injection.  He is amenable and ready to proceed.  Plan for left level 4 cervical lymph node ultrasound-guided percutaneous ethanol injection with moderate  sedation at [facility].  Marliss Coots, MD Pager: [phone]    I spent a total of 25 Minutes in virtual telephone clinical consultation, greater than 50% of which was counseling/coordinating care for metastatic thyroid cancer.

## 2023-05-19 ENCOUNTER — Ambulatory Visit
Admission: RE | Admit: 2023-05-19 | Discharge: 2023-05-19 | Disposition: A | Discharge status: Home / Self Care | Payer: Medicare PPO | Source: Ambulatory Visit | Attending: Interventional Radiology | Admitting: Interventional Radiology

## 2023-05-19 DIAGNOSIS — C73 Malignant neoplasm of thyroid gland: Secondary | ICD-10-CM | POA: Diagnosis not present

## 2023-05-19 DIAGNOSIS — C770 Secondary and unspecified malignant neoplasm of lymph nodes of head, face and neck: Secondary | ICD-10-CM

## 2023-05-19 HISTORY — PX: IR RADIOLOGIST EVAL & MGMT: IMG5224

## 2023-05-24 ENCOUNTER — Other Ambulatory Visit (HOSPITAL_COMMUNITY): Payer: Self-pay | Admitting: Interventional Radiology

## 2023-05-24 DIAGNOSIS — C539 Malignant neoplasm of cervix uteri, unspecified: Secondary | ICD-10-CM

## 2023-06-09 ENCOUNTER — Other Ambulatory Visit: Payer: Self-pay | Admitting: Radiology

## 2023-06-09 DIAGNOSIS — C73 Malignant neoplasm of thyroid gland: Secondary | ICD-10-CM

## 2023-06-09 NOTE — Progress Notes (Signed)
Referring Physician(s): Kerr,J  Supervising Physician: Marliss Coots  Patient Status:  [facility] OP  Chief Complaint: Metastatic thyroid cancer to cervical lymph nodes   Subjective: Patient familiar to IR service from left renal mass biopsy on 05/29/2016, left lower pole thyroid nodule biopsy and left cervical adenopathy biopsy on 12/29/2016, lower left cervical adenopathy biopsy on 07/29/2022  followed by consultations with Dr. Elby Showers latest on 01/05/2023. 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 He is a poor candidate for re-operation given prior thyroidectomy and left neck dissection. He has undergone radioactive iodine ablation x2.   He is now status post ultrasound guided percutaneous ethanol injection into the two prominent level 4 lower left cervical lymph nodes on 09/22/22 and on 02/09/23.There has been overall 78% volume reduction in the medial nodule now with scant/no enhancement on CEUS.  There has been overall 68% volume reduction in the lateral nodule, with continues to enhance peripherally on CEUS. After discussion with Dr. Sharl Ma regarding his hormone markers, pt continues to show favorable response but these numbers remain slightly elevated. Given these findings,  re-treatment of the lateral, persistently enhancing nodule is warranted. He presents again today for left level 4 cervical lymph node ultrasound-guided percutaneous ethanol injection with moderate sedation .   Past Medical History:  Diagnosis Date   Arthritis    "all over" (03/23/2018)   Chronic lower back pain    Curvature of spine    High cholesterol    Hypertension    Hypothyroidism    Kidney cysts 02/10/2017   bilateral   OSA on CPAP    PONV (postoperative nausea and vomiting)    "just w/back OR in ~ 1993"   Prostate CA (HCC) 2001   Thyroid cancer (HCC) 02/10/2017   Past Surgical History:  Procedure Laterality Date   BACK  SURGERY     CARDIAC CATHETERIZATION  2002   HEMATOMA EVACUATION N/A 02/13/2017   Procedure: EVACUATION HEMATOMA, NECK;  Surgeon: Christia Reading, MD;  Location: [facility] OR;  Service: ENT;  Laterality: N/A;   HERNIA REPAIR     IR RADIOLOGIST EVAL & MGMT  08/26/2022   IR RADIOLOGIST EVAL & MGMT  10/19/2022   IR RADIOLOGIST EVAL & MGMT  01/05/2023   IR RADIOLOGIST EVAL & MGMT  05/19/2023   JOINT REPLACEMENT     LUMBAR DISC SURGERY  1993   "herniated"   PROSTATECTOMY  2001   RADICAL NECK DISSECTION Left 02/12/2017   Procedure: RADICAL NECK DISSECTION;  Surgeon: Serena Colonel, MD;  Location: [facility] OR;  Service: ENT;  Laterality: Left;  Left modified neck dissection levels 1 through 4   THYROIDECTOMY N/A 02/12/2017   Procedure: THYROIDECTOMY;  Surgeon: Serena Colonel, MD;  Location: [facility] OR;  Service: ENT;  Laterality: N/A;  total   TONSILLECTOMY     TOTAL KNEE ARTHROPLASTY Left 03/23/2018   TOTAL KNEE ARTHROPLASTY Left 03/23/2018   Procedure: TOTAL KNEE ARTHROPLASTY;  Surgeon: Gean Birchwood, MD;  Location: [facility] OR;  Service: Orthopedics;  Laterality: Left;   TOTAL KNEE ARTHROPLASTY Right 03/06/2019   Procedure: Right Knee Arthroplasty;  Surgeon: Gean Birchwood, MD;  Location: [facility] ORS;  Service: Orthopedics;  Laterality: Right;   UMBILICAL HERNIA REPAIR        Allergies: Codeine  Medications: Prior to Admission medications   Medication Sig Start Date End Date Taking? Authorizing Provider  aspirin EC 81 MG tablet Take 1 tablet (81 mg  total) by mouth 2 (two) times daily. 03/06/19   Allena Katz, PA-C  Calcium Carbonate-Vitamin D (CALCIUM 600/VITAMIN D PO) Take 1 tablet by mouth daily.    [provider]  cetirizine (ZYRTEC) 10 MG tablet Take 10 mg by mouth daily.    [provider]  Cholecalciferol (VITAMIN D-3 PO) Take 250 mcg by mouth daily.     [provider]  Coenzyme Q10 (COQ10) 100 MG CAPS Take 100 mg by mouth daily.     [provider]  ILEVRO 0.3 % ophthalmic  suspension  01/31/20   [provider]  levothyroxine (SYNTHROID, LEVOTHROID) 175 MCG tablet Take 175 mcg by mouth daily before breakfast.    [provider]  lisinopril (PRINIVIL,ZESTRIL) 20 MG tablet Take 20 mg by mouth daily.    [provider]  Multiple Vitamins-Minerals (ONE-A-DAY MENS 50+ ADVANTAGE) TABS Take 1 tablet by mouth daily.    [provider]  ofloxacin (OCUFLOX) 0.3 % ophthalmic solution  02/01/20   [provider]  Omega-3 Fatty Acids (FISH OIL ULTRA) 1400 MG CAPS Take 1,400 mg by mouth daily.     [provider]  oxyCODONE-acetaminophen (PERCOCET/ROXICET) 5-325 MG tablet Take 1 tablet by mouth every 4 (four) hours as needed for severe pain. 03/06/19   Allena Katz, PA-C  PROLENSA 0.07 % SOLN  04/10/20   [provider]  simvastatin (ZOCOR) 40 MG tablet Take 40 mg by mouth daily at 6 PM.    [provider]  timolol (BETIMOL) 0.5 % ophthalmic solution Place 1 drop into both eyes daily.     [provider]  timolol (TIMOPTIC) 0.5 % ophthalmic solution  03/18/20   [provider]  tiZANidine (ZANAFLEX) 2 MG tablet Take 1 tablet (2 mg total) by mouth every 6 (six) hours as needed. 03/06/19   Allena Katz, PA-C  Turmeric 500 MG CAPS Take 500 mg by mouth 2 (two) times daily.     [provider]  valsartan-hydrochlorothiazide (DIOVAN-HCT) 80-12.5 MG tablet Take 1 tablet by mouth daily.    [provider]     Vital Signs:   Code Status:   Physical Exam  Imaging: No results found.  Labs:  CBC: Recent Labs    09/22/22 1100 02/09/23 1110  WBC 8.5 7.9  HGB 15.1 15.6  HCT 43.6 45.7  PLT 188 188    COAGS: Recent Labs    09/22/22 1100  INR 1.1    BMP: No results for input(s): "NA", "K", "CL", "CO2", "GLUCOSE", "BUN", "CALCIUM", "CREATININE", "GFRNONAA", "GFRAA" in the last 8760 hours.  Invalid input(s): "CMP"  LIVER FUNCTION TESTS: No results for  input(s): "BILITOT", "AST", "ALT", "ALKPHOS", "PROT", "ALBUMIN" in the last 8760 hours.  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 He is a poor candidate for re-operation given prior thyroidectomy and left neck dissection. He has undergone radioactive iodine ablation x2.   He is now status post ultrasound guided percutaneous ethanol injection into the two prominent level 4 lower left cervical lymph nodes on 09/22/22 and on 02/09/23.There has been overall 78% volume reduction in the medial nodule now with scant/no enhancement on CEUS.  There has been overall 68% volume reduction in the lateral nodule, with continues to enhance peripherally on CEUS. After discussion with Dr. Sharl Ma regarding his hormone markers, pt continues to show favorable response but these numbers remain slightly elevated. Given these  findings,  re-treatment of the lateral, persistently enhancing nodule is warranted. He presents again today for left level 4 cervical lymph node ultrasound-guided percutaneous ethanol injection with moderate sedation .Details/risks of procedures, including but not limited to, internal bleeding, infection, injury to adjacent structures discussed with patient/spouse with their  understanding and consent.    Electronically Signed: D. Jeananne Rama, PA-C 06/09/2023, 8:48 PM   I spent a total of 20 minutes at the the patient's bedside AND on the patient's hospital floor or unit, greater than 50% of which was counseling/coordinating care for left level 4 cervical lymph node ultrasound-guided percutaneous ethanol injection

## 2023-06-10 ENCOUNTER — Ambulatory Visit (HOSPITAL_COMMUNITY)
Admission: RE | Admit: 2023-06-10 | Discharge: 2023-06-10 | Disposition: A | Discharge status: Home / Self Care | Payer: Medicare PPO | Source: Ambulatory Visit | Attending: Interventional Radiology | Admitting: Interventional Radiology

## 2023-06-10 ENCOUNTER — Other Ambulatory Visit (HOSPITAL_COMMUNITY): Payer: Self-pay | Admitting: Interventional Radiology

## 2023-06-10 ENCOUNTER — Encounter (HOSPITAL_COMMUNITY): Payer: Self-pay

## 2023-06-10 ENCOUNTER — Other Ambulatory Visit: Payer: Self-pay

## 2023-06-10 DIAGNOSIS — Z01812 Encounter for preprocedural laboratory examination: Secondary | ICD-10-CM | POA: Insufficient documentation

## 2023-06-10 DIAGNOSIS — C539 Malignant neoplasm of cervix uteri, unspecified: Secondary | ICD-10-CM

## 2023-06-10 DIAGNOSIS — C799 Secondary malignant neoplasm of unspecified site: Secondary | ICD-10-CM | POA: Insufficient documentation

## 2023-06-10 DIAGNOSIS — C770 Secondary and unspecified malignant neoplasm of lymph nodes of head, face and neck: Secondary | ICD-10-CM | POA: Diagnosis not present

## 2023-06-10 DIAGNOSIS — C73 Malignant neoplasm of thyroid gland: Secondary | ICD-10-CM

## 2023-06-10 DIAGNOSIS — Z8546 Personal history of malignant neoplasm of prostate: Secondary | ICD-10-CM | POA: Insufficient documentation

## 2023-06-10 DIAGNOSIS — N2889 Other specified disorders of kidney and ureter: Secondary | ICD-10-CM | POA: Diagnosis not present

## 2023-06-10 DIAGNOSIS — Z9089 Acquired absence of other organs: Secondary | ICD-10-CM | POA: Insufficient documentation

## 2023-06-10 LAB — CBC WITH DIFFERENTIAL/PLATELET
Abs Immature Granulocytes: 0.01 10*3/uL (ref 0.00–0.07)
Basophils Absolute: 0 10*3/uL (ref 0.0–0.1)
Basophils Relative: 0 %
Eosinophils Absolute: 0.1 10*3/uL (ref 0.0–0.5)
Eosinophils Relative: 2 %
HCT: 42.6 % (ref 39.0–52.0)
Hemoglobin: 14.5 g/dL (ref 13.0–17.0)
Immature Granulocytes: 0 %
Lymphocytes Relative: 21 %
Lymphs Abs: 1.5 10*3/uL (ref 0.7–4.0)
MCH: 31.9 pg (ref 26.0–34.0)
MCHC: 34 g/dL (ref 30.0–36.0)
MCV: 93.6 fL (ref 80.0–100.0)
Monocytes Absolute: 0.8 10*3/uL (ref 0.1–1.0)
Monocytes Relative: 11 %
Neutro Abs: 4.8 10*3/uL (ref 1.7–7.7)
Neutrophils Relative %: 66 %
Platelets: 197 10*3/uL (ref 150–400)
RBC: 4.55 MIL/uL (ref 4.22–5.81)
RDW: 13.2 % (ref 11.5–15.5)
WBC: 7.3 10*3/uL (ref 4.0–10.5)
nRBC: 0 % (ref 0.0–0.2)

## 2023-06-10 LAB — PROTIME-INR
INR: 1.1 (ref 0.8–1.2)
Prothrombin Time: 14.3 s (ref 11.4–15.2)

## 2023-06-10 LAB — BASIC METABOLIC PANEL
Anion gap: 9 (ref 5–15)
BUN: 19 mg/dL (ref 8–23)
CO2: 24 mmol/L (ref 22–32)
Calcium: 9.3 mg/dL (ref 8.9–10.3)
Chloride: 102 mmol/L (ref 98–111)
Creatinine, Ser: 0.93 mg/dL (ref 0.61–1.24)
GFR, Estimated: >60 mL/min (ref >60)
Glucose, Bld: 126 mg/dL — ABNORMAL HIGH (ref 70–99)
Potassium: 3.9 mmol/L (ref 3.5–5.1)
Sodium: 135 mmol/L (ref 135–145)

## 2023-06-10 MED ORDER — ALCOHOL (ABLYSINOL) 99% IA SOLN
INTRA_ARTERIAL | Status: AC
  Filled 2023-06-10: qty 10

## 2023-06-10 MED ORDER — SODIUM CHLORIDE 0.9 % IV SOLN: INTRAVENOUS | Status: DC

## 2023-06-10 MED ORDER — MIDAZOLAM HCL 2 MG/2ML IJ SOLN
INTRAMUSCULAR | Status: AC
  Filled 2023-06-10: qty 2

## 2023-06-10 MED ORDER — SODIUM CHLORIDE 0.9% FLUSH: 10.0000 mL | Freq: Two times a day (BID) | INTRAVENOUS | Status: DC

## 2023-06-10 MED ORDER — LIDOCAINE HCL 1 % IJ SOLN
INTRAMUSCULAR | Status: AC
  Filled 2023-06-10: qty 20

## 2023-06-10 MED ORDER — ALCOHOL (ABLYSINOL) 99% IA SOLN
INTRA_ARTERIAL | Status: AC | PRN
  Administered 2023-06-10: .5 mL

## 2023-06-10 MED ORDER — FENTANYL CITRATE (PF) 100 MCG/2ML IJ SOLN
INTRAMUSCULAR | Status: AC | PRN
Start: 2023-06-10 — End: 2023-06-10
  Administered 2023-06-10: 25 ug via INTRAVENOUS
  Administered 2023-06-10: 50 ug via INTRAVENOUS

## 2023-06-10 MED ORDER — MIDAZOLAM HCL 2 MG/2ML IJ SOLN
INTRAMUSCULAR | Status: AC | PRN
Start: 2023-06-10 — End: 2023-06-10
  Administered 2023-06-10: .5 mg via INTRAVENOUS
  Administered 2023-06-10: 1 mg via INTRAVENOUS

## 2023-06-10 MED ORDER — FENTANYL CITRATE (PF) 100 MCG/2ML IJ SOLN
INTRAMUSCULAR | Status: AC
  Filled 2023-06-10: qty 2

## 2023-06-10 NOTE — Procedures (Signed)
Interventional Radiology Procedure Note  Procedure: Ultrasound guided percutaneous ethanol injection of left cervical lymph node   Findings: Please refer to procedural dictation for full description. 0.5 mL ethanol administered to lateral left level IV metastatic lymph node.  Complications: None immediate  Estimated Blood Loss: < 5 mL  Recommendations: 1 hour bedrest. Follow up with 3 month CEUS.  Follow up with Dr. Sharl Ma as scheduled.   Marliss Coots, MD

## 2023-06-10 NOTE — Discharge Instructions (Signed)
Please call Interventional Radiology clinic [phone] with any questions or concerns.  You may remove your dressing and shower tomorrow.  After your procedure it may be common to have: Some pain, bruising, swelling, or a burning feeling near the spot where the needle entered your skin (injection site). You may have severe pain if the alcohol leaks into nearby tissue Pain or a burning feeling around your jaw or behind your ears A fever  Follow these instructions at home:  Medication: Do not use Aspirin or ibuprofen products, such as Advil or Motrin, as it may increase bleeding.  You may resume your usual medications as ordered by your doctor If your doctor prescribed antibiotics, take them as directed. Do not stop taking them just because you feel better  Eating and drinking: Drink plenty of liquids to keep your urine pale yellow You can resume your regular diet as directed by your doctor   Care of the procedure site Follow instructions from your health care provider about how to take care of the injection site. Make sure you: Wash your hands with soap and water for at least 20 seconds before and after you change your bandage (dressing). If soap and water are not available, use hand sanitizer Change your dressing as told by your provider Check the injection site every day for signs of infection. Check for: More redness, swelling, or pain More fluid or blood Warmth Pus or a bad smell Activity Rest as told by your provider Do not sit for a long time without moving. Get up to take short walks every 1-2 hours. This will improve blood flow and breathing. Ask for help if you feel weak or unsteady Return to your normal activities as told by your provider Do not take baths, swim, or use a hot tub until your health care provider approves. Take showers only Keep all follow-up visits as told by your doctor  Contact a health care provider if: Your voice becomes hoarse, or you have trouble  speaking or swallowing Your fingers, toes, or lips start to tingle You get muscle cramps or sudden muscle tightening (spasms) You feel weak or more tired than normal You feel anxious or nervous You have a fever that lasts more than 1 day You have any signs of infection near the injection site  Get help right away if: You have severe pain that does not get better with medicine You have trouble breathing You start to make high-pitching whistling sounds when you breathe, most often when you breathe out (wheeze)  Moderate Conscious Sedation-Care After  This sheet gives you information about how to care for yourself after your procedure. Your health care provider may also give you more specific instructions. If you have problems or questions, contact your health care provider.  After the procedure, it is common to have: Sleepiness for several hours. Impaired judgment for several hours. Difficulty with balance. Vomiting if you eat too soon.  Follow these instructions at home:  Rest. Do not participate in activities where you could fall or become injured. Do not drive or use machinery. Do not drink alcohol. Do not take sleeping pills or medicines that cause drowsiness. Do not make important decisions or sign legal documents. Do not take care of children on your own.  Eating and drinking Follow the diet recommended by your health care provider. Drink enough fluid to keep your urine pale yellow. If you vomit: Drink water, juice, or soup when you can drink without vomiting. Make sure you have  little or no nausea before eating solid foods.  General instructions Take over-the-counter and prescription medicines only as told by your health care provider. Have a responsible adult stay with you for the time you are told. It is important to have someone help care for you until you are awake and alert. Do not smoke. Keep all follow-up visits as told by your health care provider. This is  important.  Contact a health care provider if: You are still sleepy or having trouble with balance after 24 hours. You feel light-headed. You keep feeling nauseous or you keep vomiting. You develop a rash. You have a fever. You have redness or swelling around the IV site.  Get help right away if: You have trouble breathing. You have new-onset confusion at home.  This information is not intended to replace advice given to you by your health care provider. Make sure you discuss any questions you have with your healthcare provider.

## 2023-06-16 DIAGNOSIS — E890 Postprocedural hypothyroidism: Secondary | ICD-10-CM | POA: Diagnosis not present

## 2023-06-16 DIAGNOSIS — C770 Secondary and unspecified malignant neoplasm of lymph nodes of head, face and neck: Secondary | ICD-10-CM | POA: Diagnosis not present

## 2023-06-16 DIAGNOSIS — C73 Malignant neoplasm of thyroid gland: Secondary | ICD-10-CM | POA: Diagnosis not present

## 2023-07-05 DIAGNOSIS — M47816 Spondylosis without myelopathy or radiculopathy, lumbar region: Secondary | ICD-10-CM | POA: Diagnosis not present

## 2023-07-13 DIAGNOSIS — H401132 Primary open-angle glaucoma, bilateral, moderate stage: Secondary | ICD-10-CM | POA: Diagnosis not present

## 2023-07-13 DIAGNOSIS — H43813 Vitreous degeneration, bilateral: Secondary | ICD-10-CM | POA: Diagnosis not present

## 2023-07-28 DIAGNOSIS — M47816 Spondylosis without myelopathy or radiculopathy, lumbar region: Secondary | ICD-10-CM | POA: Diagnosis not present

## 2023-08-04 DIAGNOSIS — D3002 Benign neoplasm of left kidney: Secondary | ICD-10-CM | POA: Diagnosis not present

## 2023-08-04 DIAGNOSIS — Z8546 Personal history of malignant neoplasm of prostate: Secondary | ICD-10-CM | POA: Diagnosis not present

## 2023-08-26 ENCOUNTER — Other Ambulatory Visit (HOSPITAL_COMMUNITY): Payer: Self-pay | Admitting: Interventional Radiology

## 2023-08-26 DIAGNOSIS — C770 Secondary and unspecified malignant neoplasm of lymph nodes of head, face and neck: Secondary | ICD-10-CM

## 2023-08-26 DIAGNOSIS — D3002 Benign neoplasm of left kidney: Secondary | ICD-10-CM | POA: Diagnosis not present

## 2023-08-26 DIAGNOSIS — C642 Malignant neoplasm of left kidney, except renal pelvis: Secondary | ICD-10-CM | POA: Diagnosis not present

## 2023-08-26 DIAGNOSIS — N281 Cyst of kidney, acquired: Secondary | ICD-10-CM | POA: Diagnosis not present

## 2023-08-30 DIAGNOSIS — M47816 Spondylosis without myelopathy or radiculopathy, lumbar region: Secondary | ICD-10-CM | POA: Diagnosis not present

## 2023-09-08 ENCOUNTER — Ambulatory Visit (HOSPITAL_COMMUNITY)
Admission: RE | Admit: 2023-09-08 | Discharge: 2023-09-08 | Disposition: A | Discharge status: Home / Self Care | Payer: Medicare PPO | Source: Ambulatory Visit | Attending: Interventional Radiology | Admitting: Interventional Radiology

## 2023-09-08 DIAGNOSIS — C770 Secondary and unspecified malignant neoplasm of lymph nodes of head, face and neck: Secondary | ICD-10-CM

## 2023-09-08 MED ORDER — SULFUR HEXAFLUORIDE MICROSPH 60.7-25 MG IJ SUSR
5.0000 mL | Freq: Once | INTRAMUSCULAR | Status: AC | PRN
  Administered 2023-09-08: 5 mL via INTRAVENOUS

## 2023-09-08 MED ORDER — SULFUR HEXAFLUORIDE MICROSPH 60.7-25 MG IJ SUSR
INTRAMUSCULAR | Status: AC
  Filled 2023-09-08: qty 5

## 2023-09-10 ENCOUNTER — Other Ambulatory Visit (HOSPITAL_COMMUNITY): Payer: Medicare PPO

## 2023-09-14 DIAGNOSIS — M7061 Trochanteric bursitis, right hip: Secondary | ICD-10-CM | POA: Diagnosis not present

## 2023-09-15 DIAGNOSIS — G4733 Obstructive sleep apnea (adult) (pediatric): Secondary | ICD-10-CM | POA: Diagnosis not present

## 2023-10-22 ENCOUNTER — Other Ambulatory Visit: Payer: Self-pay | Admitting: Internal Medicine

## 2023-10-22 DIAGNOSIS — M810 Age-related osteoporosis without current pathological fracture: Secondary | ICD-10-CM | POA: Diagnosis not present

## 2023-10-22 DIAGNOSIS — Z0000 Encounter for general adult medical examination without abnormal findings: Secondary | ICD-10-CM | POA: Diagnosis not present

## 2023-10-22 DIAGNOSIS — E890 Postprocedural hypothyroidism: Secondary | ICD-10-CM | POA: Diagnosis not present

## 2023-10-22 DIAGNOSIS — R7303 Prediabetes: Secondary | ICD-10-CM | POA: Diagnosis not present

## 2023-10-22 DIAGNOSIS — E892 Postprocedural hypoparathyroidism: Secondary | ICD-10-CM | POA: Diagnosis not present

## 2023-10-22 DIAGNOSIS — C73 Malignant neoplasm of thyroid gland: Secondary | ICD-10-CM | POA: Diagnosis not present

## 2023-10-22 DIAGNOSIS — I700 Atherosclerosis of aorta: Secondary | ICD-10-CM | POA: Diagnosis not present

## 2023-10-22 DIAGNOSIS — G4733 Obstructive sleep apnea (adult) (pediatric): Secondary | ICD-10-CM | POA: Diagnosis not present

## 2023-10-22 DIAGNOSIS — I10 Essential (primary) hypertension: Secondary | ICD-10-CM | POA: Diagnosis not present

## 2023-10-22 DIAGNOSIS — C7800 Secondary malignant neoplasm of unspecified lung: Secondary | ICD-10-CM | POA: Diagnosis not present

## 2023-10-22 DIAGNOSIS — D369 Benign neoplasm, unspecified site: Secondary | ICD-10-CM | POA: Diagnosis not present

## 2023-10-22 DIAGNOSIS — E782 Mixed hyperlipidemia: Secondary | ICD-10-CM | POA: Diagnosis not present

## 2023-11-02 DIAGNOSIS — C770 Secondary and unspecified malignant neoplasm of lymph nodes of head, face and neck: Secondary | ICD-10-CM | POA: Diagnosis not present

## 2023-11-02 DIAGNOSIS — E890 Postprocedural hypothyroidism: Secondary | ICD-10-CM | POA: Diagnosis not present

## 2023-11-02 DIAGNOSIS — C73 Malignant neoplasm of thyroid gland: Secondary | ICD-10-CM | POA: Diagnosis not present

## 2023-11-25 DIAGNOSIS — M25551 Pain in right hip: Secondary | ICD-10-CM | POA: Diagnosis not present

## 2023-11-30 DIAGNOSIS — M25551 Pain in right hip: Secondary | ICD-10-CM | POA: Diagnosis not present

## 2023-11-30 DIAGNOSIS — M7061 Trochanteric bursitis, right hip: Secondary | ICD-10-CM | POA: Diagnosis not present

## 2023-12-02 DIAGNOSIS — M25551 Pain in right hip: Secondary | ICD-10-CM | POA: Diagnosis not present

## 2023-12-02 DIAGNOSIS — M7061 Trochanteric bursitis, right hip: Secondary | ICD-10-CM | POA: Diagnosis not present

## 2023-12-06 DIAGNOSIS — M7061 Trochanteric bursitis, right hip: Secondary | ICD-10-CM | POA: Diagnosis not present

## 2023-12-06 DIAGNOSIS — M25551 Pain in right hip: Secondary | ICD-10-CM | POA: Diagnosis not present

## 2023-12-08 DIAGNOSIS — M7061 Trochanteric bursitis, right hip: Secondary | ICD-10-CM | POA: Diagnosis not present

## 2023-12-08 DIAGNOSIS — M25551 Pain in right hip: Secondary | ICD-10-CM | POA: Diagnosis not present

## 2023-12-13 DIAGNOSIS — M7061 Trochanteric bursitis, right hip: Secondary | ICD-10-CM | POA: Diagnosis not present

## 2023-12-13 DIAGNOSIS — M25551 Pain in right hip: Secondary | ICD-10-CM | POA: Diagnosis not present

## 2023-12-15 DIAGNOSIS — M7061 Trochanteric bursitis, right hip: Secondary | ICD-10-CM | POA: Diagnosis not present

## 2023-12-15 DIAGNOSIS — M25551 Pain in right hip: Secondary | ICD-10-CM | POA: Diagnosis not present

## 2023-12-16 DIAGNOSIS — H43813 Vitreous degeneration, bilateral: Secondary | ICD-10-CM | POA: Diagnosis not present

## 2023-12-16 DIAGNOSIS — H401132 Primary open-angle glaucoma, bilateral, moderate stage: Secondary | ICD-10-CM | POA: Diagnosis not present

## 2023-12-20 DIAGNOSIS — M7061 Trochanteric bursitis, right hip: Secondary | ICD-10-CM | POA: Diagnosis not present

## 2023-12-20 DIAGNOSIS — M25551 Pain in right hip: Secondary | ICD-10-CM | POA: Diagnosis not present

## 2023-12-21 DIAGNOSIS — H43813 Vitreous degeneration, bilateral: Secondary | ICD-10-CM | POA: Diagnosis not present

## 2023-12-21 DIAGNOSIS — H401132 Primary open-angle glaucoma, bilateral, moderate stage: Secondary | ICD-10-CM | POA: Diagnosis not present

## 2023-12-21 DIAGNOSIS — H5213 Myopia, bilateral: Secondary | ICD-10-CM | POA: Diagnosis not present

## 2023-12-22 DIAGNOSIS — M25551 Pain in right hip: Secondary | ICD-10-CM | POA: Diagnosis not present

## 2023-12-22 DIAGNOSIS — M7061 Trochanteric bursitis, right hip: Secondary | ICD-10-CM | POA: Diagnosis not present

## 2023-12-27 DIAGNOSIS — M25551 Pain in right hip: Secondary | ICD-10-CM | POA: Diagnosis not present

## 2024-01-11 ENCOUNTER — Other Ambulatory Visit: Payer: Self-pay | Admitting: Internal Medicine

## 2024-01-11 DIAGNOSIS — M25551 Pain in right hip: Secondary | ICD-10-CM | POA: Diagnosis not present

## 2024-01-11 DIAGNOSIS — E041 Nontoxic single thyroid nodule: Secondary | ICD-10-CM

## 2024-01-11 DIAGNOSIS — M810 Age-related osteoporosis without current pathological fracture: Secondary | ICD-10-CM

## 2024-01-12 ENCOUNTER — Other Ambulatory Visit (HOSPITAL_COMMUNITY): Payer: Self-pay | Admitting: Interventional Radiology

## 2024-01-12 DIAGNOSIS — C770 Secondary and unspecified malignant neoplasm of lymph nodes of head, face and neck: Secondary | ICD-10-CM

## 2024-01-19 DIAGNOSIS — M1611 Unilateral primary osteoarthritis, right hip: Secondary | ICD-10-CM | POA: Diagnosis not present

## 2024-01-19 DIAGNOSIS — Z96641 Presence of right artificial hip joint: Secondary | ICD-10-CM | POA: Diagnosis not present

## 2024-01-21 DIAGNOSIS — M25651 Stiffness of right hip, not elsewhere classified: Secondary | ICD-10-CM | POA: Diagnosis not present

## 2024-01-21 DIAGNOSIS — Z96641 Presence of right artificial hip joint: Secondary | ICD-10-CM | POA: Diagnosis not present

## 2024-01-21 DIAGNOSIS — R2689 Other abnormalities of gait and mobility: Secondary | ICD-10-CM | POA: Diagnosis not present

## 2024-01-21 DIAGNOSIS — M25551 Pain in right hip: Secondary | ICD-10-CM | POA: Diagnosis not present

## 2024-01-25 DIAGNOSIS — M25651 Stiffness of right hip, not elsewhere classified: Secondary | ICD-10-CM | POA: Diagnosis not present

## 2024-01-25 DIAGNOSIS — R2689 Other abnormalities of gait and mobility: Secondary | ICD-10-CM | POA: Diagnosis not present

## 2024-01-25 DIAGNOSIS — M25551 Pain in right hip: Secondary | ICD-10-CM | POA: Diagnosis not present

## 2024-01-25 DIAGNOSIS — Z96641 Presence of right artificial hip joint: Secondary | ICD-10-CM | POA: Diagnosis not present

## 2024-01-27 DIAGNOSIS — M25551 Pain in right hip: Secondary | ICD-10-CM | POA: Diagnosis not present

## 2024-01-28 DIAGNOSIS — M25551 Pain in right hip: Secondary | ICD-10-CM | POA: Diagnosis not present

## 2024-01-28 DIAGNOSIS — M25651 Stiffness of right hip, not elsewhere classified: Secondary | ICD-10-CM | POA: Diagnosis not present

## 2024-01-28 DIAGNOSIS — Z96641 Presence of right artificial hip joint: Secondary | ICD-10-CM | POA: Diagnosis not present

## 2024-01-28 DIAGNOSIS — R2689 Other abnormalities of gait and mobility: Secondary | ICD-10-CM | POA: Diagnosis not present

## 2024-01-31 DIAGNOSIS — M25651 Stiffness of right hip, not elsewhere classified: Secondary | ICD-10-CM | POA: Diagnosis not present

## 2024-01-31 DIAGNOSIS — Z96641 Presence of right artificial hip joint: Secondary | ICD-10-CM | POA: Diagnosis not present

## 2024-01-31 DIAGNOSIS — M25551 Pain in right hip: Secondary | ICD-10-CM | POA: Diagnosis not present

## 2024-01-31 DIAGNOSIS — R2689 Other abnormalities of gait and mobility: Secondary | ICD-10-CM | POA: Diagnosis not present

## 2024-02-01 DIAGNOSIS — Z96641 Presence of right artificial hip joint: Secondary | ICD-10-CM | POA: Diagnosis not present

## 2024-02-01 DIAGNOSIS — T8149XA Infection following a procedure, other surgical site, initial encounter: Secondary | ICD-10-CM | POA: Diagnosis not present

## 2024-02-02 DIAGNOSIS — Z96641 Presence of right artificial hip joint: Secondary | ICD-10-CM | POA: Diagnosis not present

## 2024-02-02 DIAGNOSIS — R2689 Other abnormalities of gait and mobility: Secondary | ICD-10-CM | POA: Diagnosis not present

## 2024-02-02 DIAGNOSIS — M25551 Pain in right hip: Secondary | ICD-10-CM | POA: Diagnosis not present

## 2024-02-02 DIAGNOSIS — M25651 Stiffness of right hip, not elsewhere classified: Secondary | ICD-10-CM | POA: Diagnosis not present

## 2024-02-07 DIAGNOSIS — Z96641 Presence of right artificial hip joint: Secondary | ICD-10-CM | POA: Diagnosis not present

## 2024-02-07 DIAGNOSIS — M25551 Pain in right hip: Secondary | ICD-10-CM | POA: Diagnosis not present

## 2024-02-07 DIAGNOSIS — M25651 Stiffness of right hip, not elsewhere classified: Secondary | ICD-10-CM | POA: Diagnosis not present

## 2024-02-07 DIAGNOSIS — R2689 Other abnormalities of gait and mobility: Secondary | ICD-10-CM | POA: Diagnosis not present

## 2024-02-09 DIAGNOSIS — R2689 Other abnormalities of gait and mobility: Secondary | ICD-10-CM | POA: Diagnosis not present

## 2024-02-09 DIAGNOSIS — M25551 Pain in right hip: Secondary | ICD-10-CM | POA: Diagnosis not present

## 2024-02-09 DIAGNOSIS — Z96641 Presence of right artificial hip joint: Secondary | ICD-10-CM | POA: Diagnosis not present

## 2024-02-09 DIAGNOSIS — M25651 Stiffness of right hip, not elsewhere classified: Secondary | ICD-10-CM | POA: Diagnosis not present

## 2024-02-15 ENCOUNTER — Ambulatory Visit (HOSPITAL_COMMUNITY)
Admission: RE | Admit: 2024-02-15 | Discharge: 2024-02-15 | Disposition: A | Discharge status: Home / Self Care | Source: Ambulatory Visit | Attending: Interventional Radiology | Admitting: Interventional Radiology

## 2024-02-15 DIAGNOSIS — R221 Localized swelling, mass and lump, neck: Secondary | ICD-10-CM | POA: Diagnosis not present

## 2024-02-15 DIAGNOSIS — C770 Secondary and unspecified malignant neoplasm of lymph nodes of head, face and neck: Secondary | ICD-10-CM | POA: Insufficient documentation

## 2024-02-15 MED ORDER — SULFUR HEXAFLUORIDE MICROSPH 60.7-25 MG IJ SUSR
INTRAMUSCULAR | Status: AC
  Filled 2024-02-15: qty 5

## 2024-02-15 MED ORDER — SULFUR HEXAFLUORIDE MICROSPH 60.7-25 MG IJ SUSR
5.0000 mL | Freq: Once | INTRAMUSCULAR | Status: AC | PRN
  Administered 2024-02-15: 2.4 mL via INTRAVENOUS

## 2024-02-15 NOTE — Progress Notes (Signed)
 Pt. Tolerated lumason  thyroid  us  well.

## 2024-02-15 NOTE — Progress Notes (Signed)
 Pre vitals for lumason  thryoid ultrasound.

## 2024-02-22 DIAGNOSIS — G4733 Obstructive sleep apnea (adult) (pediatric): Secondary | ICD-10-CM | POA: Diagnosis not present

## 2024-02-29 DIAGNOSIS — E1169 Type 2 diabetes mellitus with other specified complication: Secondary | ICD-10-CM | POA: Diagnosis not present

## 2024-03-02 DIAGNOSIS — D4102 Neoplasm of uncertain behavior of left kidney: Secondary | ICD-10-CM | POA: Diagnosis not present

## 2024-03-02 DIAGNOSIS — N281 Cyst of kidney, acquired: Secondary | ICD-10-CM | POA: Diagnosis not present

## 2024-03-02 DIAGNOSIS — Z8546 Personal history of malignant neoplasm of prostate: Secondary | ICD-10-CM | POA: Diagnosis not present

## 2024-03-06 ENCOUNTER — Other Ambulatory Visit: Payer: Self-pay | Admitting: Urology

## 2024-03-06 ENCOUNTER — Encounter: Payer: Self-pay | Admitting: Urology

## 2024-03-06 DIAGNOSIS — D4102 Neoplasm of uncertain behavior of left kidney: Secondary | ICD-10-CM

## 2024-03-06 DIAGNOSIS — N281 Cyst of kidney, acquired: Secondary | ICD-10-CM

## 2024-03-09 ENCOUNTER — Other Ambulatory Visit

## 2024-03-20 ENCOUNTER — Ambulatory Visit
Admission: RE | Admit: 2024-03-20 | Discharge: 2024-03-20 | Disposition: A | Discharge status: Home / Self Care | Source: Ambulatory Visit | Attending: Urology | Admitting: Urology

## 2024-03-20 DIAGNOSIS — D4102 Neoplasm of uncertain behavior of left kidney: Secondary | ICD-10-CM

## 2024-03-20 DIAGNOSIS — N281 Cyst of kidney, acquired: Secondary | ICD-10-CM | POA: Diagnosis not present

## 2024-03-20 DIAGNOSIS — I700 Atherosclerosis of aorta: Secondary | ICD-10-CM | POA: Diagnosis not present

## 2024-03-20 DIAGNOSIS — I517 Cardiomegaly: Secondary | ICD-10-CM | POA: Diagnosis not present

## 2024-03-20 MED ORDER — GADOPICLENOL 0.5 MMOL/ML IV SOLN
10.0000 mL | Freq: Once | INTRAVENOUS | Status: AC | PRN
  Administered 2024-03-20: 10 mL via INTRAVENOUS

## 2024-04-13 DIAGNOSIS — M25551 Pain in right hip: Secondary | ICD-10-CM | POA: Diagnosis not present

## 2024-04-18 DIAGNOSIS — E1169 Type 2 diabetes mellitus with other specified complication: Secondary | ICD-10-CM | POA: Diagnosis not present

## 2024-04-18 DIAGNOSIS — D369 Benign neoplasm, unspecified site: Secondary | ICD-10-CM | POA: Diagnosis not present

## 2024-04-18 DIAGNOSIS — Z8546 Personal history of malignant neoplasm of prostate: Secondary | ICD-10-CM | POA: Diagnosis not present

## 2024-04-18 DIAGNOSIS — E890 Postprocedural hypothyroidism: Secondary | ICD-10-CM | POA: Diagnosis not present

## 2024-04-18 DIAGNOSIS — C73 Malignant neoplasm of thyroid gland: Secondary | ICD-10-CM | POA: Diagnosis not present

## 2024-04-18 DIAGNOSIS — C799 Secondary malignant neoplasm of unspecified site: Secondary | ICD-10-CM | POA: Diagnosis not present

## 2024-04-18 DIAGNOSIS — I10 Essential (primary) hypertension: Secondary | ICD-10-CM | POA: Diagnosis not present

## 2024-04-27 DIAGNOSIS — C73 Malignant neoplasm of thyroid gland: Secondary | ICD-10-CM | POA: Diagnosis not present

## 2024-04-27 DIAGNOSIS — C770 Secondary and unspecified malignant neoplasm of lymph nodes of head, face and neck: Secondary | ICD-10-CM | POA: Diagnosis not present

## 2024-04-27 DIAGNOSIS — E890 Postprocedural hypothyroidism: Secondary | ICD-10-CM | POA: Diagnosis not present

## 2024-05-04 DIAGNOSIS — C73 Malignant neoplasm of thyroid gland: Secondary | ICD-10-CM | POA: Diagnosis not present

## 2024-05-04 DIAGNOSIS — C770 Secondary and unspecified malignant neoplasm of lymph nodes of head, face and neck: Secondary | ICD-10-CM | POA: Diagnosis not present

## 2024-05-04 DIAGNOSIS — E890 Postprocedural hypothyroidism: Secondary | ICD-10-CM | POA: Diagnosis not present

## 2024-05-23 DIAGNOSIS — H401132 Primary open-angle glaucoma, bilateral, moderate stage: Secondary | ICD-10-CM | POA: Diagnosis not present

## 2024-05-23 DIAGNOSIS — H5213 Myopia, bilateral: Secondary | ICD-10-CM | POA: Diagnosis not present

## 2024-05-23 DIAGNOSIS — H26493 Other secondary cataract, bilateral: Secondary | ICD-10-CM | POA: Diagnosis not present

## 2024-05-23 DIAGNOSIS — H43813 Vitreous degeneration, bilateral: Secondary | ICD-10-CM | POA: Diagnosis not present

## 2024-06-01 DIAGNOSIS — R399 Unspecified symptoms and signs involving the genitourinary system: Secondary | ICD-10-CM | POA: Diagnosis not present

## 2024-06-01 DIAGNOSIS — R3915 Urgency of urination: Secondary | ICD-10-CM | POA: Diagnosis not present

## 2024-06-01 DIAGNOSIS — D4102 Neoplasm of uncertain behavior of left kidney: Secondary | ICD-10-CM | POA: Diagnosis not present

## 2024-06-02 DIAGNOSIS — C770 Secondary and unspecified malignant neoplasm of lymph nodes of head, face and neck: Secondary | ICD-10-CM | POA: Diagnosis not present

## 2024-06-02 DIAGNOSIS — C73 Malignant neoplasm of thyroid gland: Secondary | ICD-10-CM | POA: Diagnosis not present

## 2024-06-16 ENCOUNTER — Other Ambulatory Visit: Payer: Medicare PPO

## 2024-07-13 DIAGNOSIS — G8929 Other chronic pain: Secondary | ICD-10-CM | POA: Diagnosis not present

## 2024-07-13 DIAGNOSIS — M5441 Lumbago with sciatica, right side: Secondary | ICD-10-CM | POA: Diagnosis not present

## 2024-07-13 DIAGNOSIS — Z96641 Presence of right artificial hip joint: Secondary | ICD-10-CM | POA: Diagnosis not present

## 2024-07-19 ENCOUNTER — Ambulatory Visit (HOSPITAL_BASED_OUTPATIENT_CLINIC_OR_DEPARTMENT_OTHER)
Admission: RE | Admit: 2024-07-19 | Discharge: 2024-07-19 | Disposition: A | Discharge status: Home / Self Care | Source: Ambulatory Visit | Attending: Internal Medicine | Admitting: Internal Medicine

## 2024-07-19 DIAGNOSIS — M810 Age-related osteoporosis without current pathological fracture: Secondary | ICD-10-CM | POA: Diagnosis not present

## 2024-07-19 DIAGNOSIS — M85831 Other specified disorders of bone density and structure, right forearm: Secondary | ICD-10-CM | POA: Diagnosis not present
